# Patient Record
Sex: Female | Born: 1950 | Race: White | Hispanic: No | Marital: Single | Smoking: Never smoker
Health system: Southern US, Community
[De-identification: ages and names within clinical notes are randomized; demographics above are authoritative.]

## PROBLEM LIST (undated history)

## (undated) DIAGNOSIS — F419 Anxiety disorder, unspecified: Secondary | ICD-10-CM

## (undated) DIAGNOSIS — M5136 Other intervertebral disc degeneration, lumbar region: Secondary | ICD-10-CM

## (undated) DIAGNOSIS — M51369 Other intervertebral disc degeneration, lumbar region without mention of lumbar back pain or lower extremity pain: Secondary | ICD-10-CM

## (undated) DIAGNOSIS — R002 Palpitations: Secondary | ICD-10-CM

## (undated) DIAGNOSIS — L409 Psoriasis, unspecified: Secondary | ICD-10-CM

## (undated) DIAGNOSIS — I341 Nonrheumatic mitral (valve) prolapse: Secondary | ICD-10-CM

## (undated) DIAGNOSIS — I7121 Aneurysm of the ascending aorta, without rupture: Secondary | ICD-10-CM

## (undated) HISTORY — DX: Psoriasis, unspecified: L40.9

## (undated) HISTORY — DX: Palpitations: R00.2

## (undated) HISTORY — DX: Anxiety disorder, unspecified: F41.9

## (undated) HISTORY — DX: Aneurysm of the ascending aorta, without rupture: I71.21

---

## 1998-09-05 ENCOUNTER — Other Ambulatory Visit: Admission: RE | Admit: 1998-09-05 | Discharge: 1998-09-05 | Payer: Self-pay | Admitting: Obstetrics and Gynecology

## 1999-09-08 ENCOUNTER — Other Ambulatory Visit: Admission: RE | Admit: 1999-09-08 | Discharge: 1999-09-08 | Payer: Self-pay | Admitting: Obstetrics and Gynecology

## 2000-10-11 ENCOUNTER — Other Ambulatory Visit: Admission: RE | Admit: 2000-10-11 | Discharge: 2000-10-11 | Payer: Self-pay | Admitting: Obstetrics and Gynecology

## 2001-10-18 ENCOUNTER — Other Ambulatory Visit: Admission: RE | Admit: 2001-10-18 | Discharge: 2001-10-18 | Payer: Self-pay | Admitting: Obstetrics and Gynecology

## 2002-10-31 ENCOUNTER — Other Ambulatory Visit: Admission: RE | Admit: 2002-10-31 | Discharge: 2002-10-31 | Payer: Self-pay | Admitting: Obstetrics and Gynecology

## 2003-11-14 ENCOUNTER — Other Ambulatory Visit: Admission: RE | Admit: 2003-11-14 | Discharge: 2003-11-14 | Payer: Self-pay | Admitting: Obstetrics and Gynecology

## 2004-12-11 ENCOUNTER — Other Ambulatory Visit: Admission: RE | Admit: 2004-12-11 | Discharge: 2004-12-11 | Payer: Self-pay | Admitting: Obstetrics and Gynecology

## 2005-12-29 ENCOUNTER — Other Ambulatory Visit: Admission: RE | Admit: 2005-12-29 | Discharge: 2005-12-29 | Payer: Self-pay | Admitting: Obstetrics and Gynecology

## 2006-08-24 ENCOUNTER — Encounter: Admission: RE | Admit: 2006-08-24 | Discharge: 2006-08-24 | Payer: Self-pay | Admitting: Family Medicine

## 2007-08-16 ENCOUNTER — Encounter: Admission: RE | Admit: 2007-08-16 | Discharge: 2007-08-16 | Payer: Self-pay | Admitting: Family Medicine

## 2008-06-09 ENCOUNTER — Encounter: Admission: RE | Admit: 2008-06-09 | Discharge: 2008-06-09 | Payer: Self-pay | Admitting: Family Medicine

## 2009-02-20 ENCOUNTER — Emergency Department (HOSPITAL_COMMUNITY): Admission: EM | Admit: 2009-02-20 | Discharge: 2009-02-20 | Payer: Self-pay | Admitting: Emergency Medicine

## 2010-05-28 ENCOUNTER — Encounter: Admission: RE | Admit: 2010-05-28 | Discharge: 2010-05-28 | Payer: Self-pay | Admitting: Family Medicine

## 2010-07-01 ENCOUNTER — Encounter: Admission: RE | Admit: 2010-07-01 | Discharge: 2010-07-01 | Payer: Self-pay | Admitting: Orthopaedic Surgery

## 2010-12-20 ENCOUNTER — Encounter: Payer: Self-pay | Admitting: Family Medicine

## 2010-12-21 ENCOUNTER — Encounter: Payer: Self-pay | Admitting: Family Medicine

## 2010-12-21 ENCOUNTER — Encounter: Payer: Self-pay | Admitting: Obstetrics and Gynecology

## 2013-01-13 ENCOUNTER — Encounter (HOSPITAL_COMMUNITY): Payer: Self-pay | Admitting: Emergency Medicine

## 2013-01-13 ENCOUNTER — Emergency Department (HOSPITAL_COMMUNITY)
Admission: EM | Admit: 2013-01-13 | Discharge: 2013-01-13 | Disposition: A | Discharge status: Home / Self Care | Payer: BC Managed Care – PPO | Attending: Emergency Medicine | Admitting: Emergency Medicine

## 2013-01-13 ENCOUNTER — Emergency Department (HOSPITAL_COMMUNITY): Payer: BC Managed Care – PPO

## 2013-01-13 DIAGNOSIS — Z79899 Other long term (current) drug therapy: Secondary | ICD-10-CM | POA: Insufficient documentation

## 2013-01-13 DIAGNOSIS — Y9229 Other specified public building as the place of occurrence of the external cause: Secondary | ICD-10-CM | POA: Insufficient documentation

## 2013-01-13 DIAGNOSIS — W010XXA Fall on same level from slipping, tripping and stumbling without subsequent striking against object, initial encounter: Secondary | ICD-10-CM | POA: Insufficient documentation

## 2013-01-13 DIAGNOSIS — Y9389 Activity, other specified: Secondary | ICD-10-CM | POA: Insufficient documentation

## 2013-01-13 DIAGNOSIS — Z8679 Personal history of other diseases of the circulatory system: Secondary | ICD-10-CM | POA: Insufficient documentation

## 2013-01-13 DIAGNOSIS — S32009A Unspecified fracture of unspecified lumbar vertebra, initial encounter for closed fracture: Secondary | ICD-10-CM | POA: Insufficient documentation

## 2013-01-13 DIAGNOSIS — S32000A Wedge compression fracture of unspecified lumbar vertebra, initial encounter for closed fracture: Secondary | ICD-10-CM

## 2013-01-13 DIAGNOSIS — Z8739 Personal history of other diseases of the musculoskeletal system and connective tissue: Secondary | ICD-10-CM | POA: Insufficient documentation

## 2013-01-13 HISTORY — DX: Nonrheumatic mitral (valve) prolapse: I34.1

## 2013-01-13 HISTORY — DX: Other intervertebral disc degeneration, lumbar region: M51.36

## 2013-01-13 HISTORY — DX: Other intervertebral disc degeneration, lumbar region without mention of lumbar back pain or lower extremity pain: M51.369

## 2013-01-13 MED ORDER — IBUPROFEN 400 MG PO TABS: 600.0000 mg | ORAL_TABLET | Freq: Once | ORAL | Status: DC

## 2013-01-13 MED ORDER — OXYCODONE-ACETAMINOPHEN 5-325 MG PO TABS: 1.0000 | ORAL_TABLET | ORAL | Status: DC | PRN

## 2013-01-13 NOTE — ED Notes (Signed)
Patient claims she fell on black ice this morning when she was going into work.

## 2013-01-13 NOTE — ED Provider Notes (Signed)
History    62 year old female with lower back pain. Patient had a mechanical fall earlier today where she was getting ready to go work. Persistent back pain since. Patient has been able to work although she does have some increase in pain when she walks. No acute numbness, tingling or loss of strength. No urinary complaints. Pain does not radiate. No intervention prior to arrival. Patient declining pain medication.  CSN: [id number]  Arrival date & time 01/13/13  4098   First MD Initiated Contact with Patient 01/13/13 0940      Chief Complaint  Patient presents with  . Fall    (Consider location/radiation/quality/duration/timing/severity/associated sxs/prior treatment) HPI  Past Medical History  Diagnosis Date  . Degenerative disc disease, lumbar   . Mitral valve prolapse     History reviewed. No pertinent past surgical history.  No family history on file.  History  Substance Use Topics  . Smoking status: Never Smoker   . Smokeless tobacco: Not on file  . Alcohol Use: No    OB History   Grav Para Term Preterm Abortions TAB SAB Ect Mult Living                  Review of Systems  All systems reviewed and negative, other than as noted in HPI.   Allergies  Sulfa antibiotics; Ciprofloxacin; Codeine; Other; and Penicillins  Home Medications   Current Outpatient Rx  Name  Route  Sig  Dispense  Refill  . Calcium Carbonate-Vitamin D (CALCIUM + D PO)   Oral   Take 1 tablet by mouth daily.         Marland Kitchen LORazepam (ATIVAN) 1 MG tablet   Oral   Take 1 mg by mouth every 8 (eight) hours. For anxiety         . Multiple Vitamin (DAILY VITAMINS PO)   Oral   Take 15 mLs by mouth daily.         . propranolol (INDERAL) 20 MG tablet   Oral   Take 10-20 mg by mouth 3 (three) times daily. Takes 20 mg first 2 doses and 10 mg last dose daily         . Saline (SIMPLY SALINE) 0.9 % AERS   Nasal   Place 1 spray into the nose daily.         Marland Kitchen oxyCODONE-acetaminophen  (PERCOCET/ROXICET) 5-325 MG per tablet   Oral   Take 1 tablet by mouth every 4 (four) hours as needed for pain.   15 tablet   0     BP 119/52  Pulse 87  Temp(Src) 98.2 F (36.8 C) (Oral)  Resp 18  SpO2 99%  Physical Exam  Nursing note and vitals reviewed. Constitutional: She appears well-developed and well-nourished. No distress.  HENT:  Head: Normocephalic and atraumatic.  Eyes: Conjunctivae are normal. Right eye exhibits no discharge. Left eye exhibits no discharge.  Neck: Neck supple.  Cardiovascular: Normal rate, regular rhythm and normal heart sounds.  Exam reveals no gallop and no friction rub.   No murmur heard. Pulmonary/Chest: Effort normal and breath sounds normal. No respiratory distress.  Abdominal: Soft. She exhibits no distension. There is no tenderness.  Musculoskeletal: She exhibits no edema and no tenderness.  Some mild tenderness in the midline in the lower lumbar region. No midline spinal tenderness elsewhere. No step-off or deformity. No concerning overlying skin changes. Pelvis is stable to rocking. No pain with range of motion of either hip.  Neurological: She is alert.  Skin: Skin is warm and dry.  Psychiatric: She has a normal mood and affect. Her behavior is normal. Thought content normal.    ED Course  Procedures (including critical care time)  Labs Reviewed - No data to display Dg Lumbar Spine Complete  01/13/2013  *RADIOLOGY REPORT*  Clinical Data: History of injury from fall with low back pain.  LUMBAR SPINE - COMPLETE 4+ VIEW  Comparison: Lumbar MRI 07/10/2006.  Findings: There are five non-rib bearing lumbar-type vertebral bodies.  On the AP image there is very slight scoliosis or splinting convexity to the left.  No pars defects are seen.  There is minimal facet degenerative spondylosis at L5-S1.  Since the 2007 MRI examination there is loss of chronic of the vertebral body of L5 and development of concavity of the superior endplate of L5  vertebral body consistent with compression fracture. The age of L5 compression fracture is not known but it is new since the 2070 MRI study.  By plain imaging no retropulsion of bone is evident.  No other evidence of fracture is seen.  No subluxation or dislocation is evident.  SI joints appear intact.  There is fecal distention of portions of the colon.  IMPRESSION: Since the 2007 MRI examination there is loss of chronic of the vertebral body of L5 and development of concavity of the superior endplate of L5 vertebral body consistent with compression fracture. The age of L5 compression fracture is not known but it is new since the 2070 MRI study.   Original Report Authenticated By: Onalee Hua Call      1. Compression fracture of lumbar vertebra       MDM  62 year old female with lower back pain after fall. Imaging shows lumbar compression fracture of indeterminate age. Clinical suspicion that her pain is more related to muscular strain. She is ambulating with little apparent difficulty. Patient has a nonfocal neurological examination. We'll treat symptomatically. Patient has previously seen orthopedics for her lower back pain. Followup as needed.       Raeford Razor, MD 01/15/13 [id number]

## 2014-11-21 ENCOUNTER — Other Ambulatory Visit: Payer: Self-pay | Admitting: Orthopaedic Surgery

## 2014-11-21 DIAGNOSIS — S32000S Wedge compression fracture of unspecified lumbar vertebra, sequela: Secondary | ICD-10-CM

## 2014-11-21 DIAGNOSIS — M545 Low back pain: Secondary | ICD-10-CM

## 2014-12-26 DIAGNOSIS — F419 Anxiety disorder, unspecified: Secondary | ICD-10-CM | POA: Diagnosis not present

## 2014-12-26 DIAGNOSIS — R634 Abnormal weight loss: Secondary | ICD-10-CM | POA: Diagnosis not present

## 2015-08-20 ENCOUNTER — Emergency Department (HOSPITAL_COMMUNITY): Payer: Medicare Other

## 2015-08-20 ENCOUNTER — Emergency Department (HOSPITAL_COMMUNITY)
Admission: EM | Admit: 2015-08-20 | Discharge: 2015-08-20 | Disposition: A | Discharge status: Home / Self Care | Payer: Medicare Other | Attending: Emergency Medicine | Admitting: Emergency Medicine

## 2015-08-20 ENCOUNTER — Encounter (HOSPITAL_COMMUNITY): Payer: Self-pay | Admitting: *Deleted

## 2015-08-20 DIAGNOSIS — W01198A Fall on same level from slipping, tripping and stumbling with subsequent striking against other object, initial encounter: Secondary | ICD-10-CM | POA: Diagnosis not present

## 2015-08-20 DIAGNOSIS — Y9389 Activity, other specified: Secondary | ICD-10-CM | POA: Diagnosis not present

## 2015-08-20 DIAGNOSIS — S4992XA Unspecified injury of left shoulder and upper arm, initial encounter: Secondary | ICD-10-CM | POA: Diagnosis present

## 2015-08-20 DIAGNOSIS — Y9289 Other specified places as the place of occurrence of the external cause: Secondary | ICD-10-CM | POA: Diagnosis not present

## 2015-08-20 DIAGNOSIS — S060X0A Concussion without loss of consciousness, initial encounter: Secondary | ICD-10-CM | POA: Insufficient documentation

## 2015-08-20 DIAGNOSIS — S098XXA Other specified injuries of head, initial encounter: Secondary | ICD-10-CM | POA: Diagnosis not present

## 2015-08-20 DIAGNOSIS — Z79899 Other long term (current) drug therapy: Secondary | ICD-10-CM | POA: Insufficient documentation

## 2015-08-20 DIAGNOSIS — Z880 Allergy status to penicillin: Secondary | ICD-10-CM | POA: Diagnosis not present

## 2015-08-20 DIAGNOSIS — S8992XA Unspecified injury of left lower leg, initial encounter: Secondary | ICD-10-CM | POA: Diagnosis not present

## 2015-08-20 DIAGNOSIS — Z8739 Personal history of other diseases of the musculoskeletal system and connective tissue: Secondary | ICD-10-CM | POA: Insufficient documentation

## 2015-08-20 DIAGNOSIS — I341 Nonrheumatic mitral (valve) prolapse: Secondary | ICD-10-CM | POA: Insufficient documentation

## 2015-08-20 DIAGNOSIS — Y998 Other external cause status: Secondary | ICD-10-CM | POA: Diagnosis not present

## 2015-08-20 DIAGNOSIS — R52 Pain, unspecified: Secondary | ICD-10-CM | POA: Diagnosis not present

## 2015-08-20 DIAGNOSIS — S40012A Contusion of left shoulder, initial encounter: Secondary | ICD-10-CM

## 2015-08-20 DIAGNOSIS — S8991XA Unspecified injury of right lower leg, initial encounter: Secondary | ICD-10-CM | POA: Diagnosis not present

## 2015-08-20 DIAGNOSIS — S0990XA Unspecified injury of head, initial encounter: Secondary | ICD-10-CM | POA: Diagnosis not present

## 2015-08-20 MED ORDER — IBUPROFEN 800 MG PO TABS: 800.0000 mg | ORAL_TABLET | Freq: Three times a day (TID) | ORAL | Status: DC

## 2015-08-20 NOTE — ED Provider Notes (Signed)
CSN: [id number]     Arrival date & time 08/20/15  1925 History   First MD Initiated Contact with Patient 08/20/15 1949     Chief Complaint  Patient presents with  . Fall     (Consider location/radiation/quality/duration/timing/severity/associated sxs/prior Treatment) HPI  The pt has hx of fall that occurred just prior to arrival at the store when her leg slipped on something - she fell and struck her L face on the floor - no LOC, no HA, no n/v/ and no numbness / weakness or difficulty ambulating - she struck her L shoulder and her bilateral knees.  Pain is mild, consdtant and not assocaited with laceration.  Past Medical History  Diagnosis Date  . Degenerative disc disease, lumbar   . Mitral valve prolapse    History reviewed. No pertinent past surgical history. No family history on file. Social History  Substance Use Topics  . Smoking status: Never Smoker   . Smokeless tobacco: None  . Alcohol Use: No   OB History    No data available     Review of Systems  All other systems reviewed and are negative.     Allergies  Sulfa antibiotics; Ciprofloxacin; Codeine; Other; and Penicillins  Home Medications   Prior to Admission medications   Medication Sig Start Date End Date Taking? Authorizing Provider  Calcium Carbonate-Vitamin D (CALCIUM + D PO) Take 1 tablet by mouth daily.   Yes Historical Provider, MD  LORazepam (ATIVAN) 1 MG tablet Take 1 mg by mouth every 8 (eight) hours. For anxiety   Yes Historical Provider, MD  Multiple Vitamin (DAILY VITAMINS PO) Take 15 mLs by mouth daily.   Yes Historical Provider, MD  propranolol (INDERAL) 10 MG tablet Take 5-10 mg by mouth 3 (three) times daily. Takes 10 mg first two doses and then 5 mg last dose.   Yes Historical Provider, MD  Saline (SIMPLY SALINE) 0.9 % AERS Place 1 spray into the nose 2 (two) times daily.    Yes Historical Provider, MD  ibuprofen (ADVIL,MOTRIN) 800 MG tablet Take 1 tablet (800 mg total) by mouth 3 (three)  times daily. 08/20/15   Noemi Chapel, MD   BP 158/62 mmHg  Pulse 87  Temp(Src) 98.1 F (36.7 C) (Oral)  Resp 17  SpO2 100% Physical Exam  Constitutional: She appears well-developed and well-nourished. No distress.  HENT:  Head: Normocephalic.  Mouth/Throat: Oropharynx is clear and moist. No oropharyngeal exudate.  No hemotympanum, no malocclusion, no raccoon eyes, no battle sign, mild tenderness over the left zygoma, no swelling, no hematoma, no contusion  Eyes: Conjunctivae and EOM are normal. Pupils are equal, round, and reactive to light. Right eye exhibits no discharge. Left eye exhibits no discharge. No scleral icterus.  Neck: Normal range of motion. Neck supple. No JVD present. No thyromegaly present.  Full range of motion, no tenderness over the posterior cervical spine  Cardiovascular: Normal rate, regular rhythm, normal heart sounds and intact distal pulses.  Exam reveals no gallop and no friction rub.   No murmur heard. Pulmonary/Chest: Effort normal and breath sounds normal. No respiratory distress. She has no wheezes. She has no rales.  Abdominal: Soft. Bowel sounds are normal. She exhibits no distension and no mass. There is no tenderness.  Musculoskeletal: Normal range of motion. She exhibits tenderness ( Tenderness over the left shoulder, decreased range of motion secondary to pain, mild tenderness of the bilateral patella, able to lift both legs in straight leg fashion without difficulty, normal range  of motion of the knees). She exhibits no edema.  Lymphadenopathy:    She has no cervical adenopathy.  Neurological: She is alert. Coordination normal.  Skin: Skin is warm and dry. No rash noted. No erythema.  Psychiatric: She has a normal mood and affect. Her behavior is normal.  Nursing note and vitals reviewed.   ED Course  Procedures (including critical care time) Labs Review Labs Reviewed - No data to display  Imaging Review Dg Shoulder Left  08/20/2015   CLINICAL  DATA:  64 year old female with left-sided shoulder pain after falling while shopping earlier today  EXAM: LEFT SHOULDER - 2+ VIEW  COMPARISON:  None.  FINDINGS: There is no evidence of fracture or dislocation. There is no evidence of arthropathy or other focal bone abnormality. Soft tissues are unremarkable.  IMPRESSION: Negative.   Electronically Signed   By: Jacqulynn Cadet M.D.   On: 08/20/2015 21:30   I have personally reviewed and evaluated these images and lab results as part of my medical decision-making.    MDM   Final diagnoses:  Contusion of shoulder, left, initial encounter  Concussion, without loss of consciousness, initial encounter    Mild head injury, mild musculoskeletal injuries, imaging of the left shoulder secondary to decreased range of motion, no need for head imaging, low risk by criteria, denies pain medication, declines offers for pain medication, agreeable to ice pack for the shoulder  Xray neg,   I have personally viewed and interpreted the imaging and agree with radiologist interpretation.  VS normal -   Meds given in ED:  Medications - No data to display  New Prescriptions   IBUPROFEN (ADVIL,MOTRIN) 800 MG TABLET    Take 1 tablet (800 mg total) by mouth 3 (three) times daily.      Noemi Chapel, MD 08/20/15 2136

## 2015-08-20 NOTE — ED Notes (Signed)
Pt arrives via EMS. Pt was at [facility] and slipped on a toothpick and fell onto her left side. Pt c/o abrasion to the left cheek bone, left shoulder abrasion and bruising, left hip pain, head pain (no LOC, no blood thinners).

## 2015-08-20 NOTE — Discharge Instructions (Signed)

## 2015-08-20 NOTE — ED Notes (Signed)
Pt states there was a toothpick on tile floor and she skid on it, she hit her cheek, shoulder and both her knees. She hit her head and had an immediate headache, pt denies LOC.

## 2015-08-20 NOTE — ED Notes (Signed)
Bed: TC48 Expected date:  Expected time:  Means of arrival:  Comments: EMS fall elderly

## 2015-08-30 ENCOUNTER — Ambulatory Visit
Admission: RE | Admit: 2015-08-30 | Discharge: 2015-08-30 | Disposition: A | Discharge status: Home / Self Care | Payer: Medicare Other | Source: Ambulatory Visit | Attending: Family Medicine | Admitting: Family Medicine

## 2015-08-30 ENCOUNTER — Other Ambulatory Visit: Payer: Self-pay | Admitting: Family Medicine

## 2015-08-30 DIAGNOSIS — M25552 Pain in left hip: Secondary | ICD-10-CM

## 2017-05-14 ENCOUNTER — Other Ambulatory Visit: Payer: Self-pay | Admitting: Family Medicine

## 2017-05-14 DIAGNOSIS — R110 Nausea: Secondary | ICD-10-CM

## 2017-05-14 DIAGNOSIS — R1011 Right upper quadrant pain: Secondary | ICD-10-CM

## 2017-09-13 DIAGNOSIS — K640 First degree hemorrhoids: Secondary | ICD-10-CM | POA: Diagnosis not present

## 2017-09-13 DIAGNOSIS — R109 Unspecified abdominal pain: Secondary | ICD-10-CM | POA: Diagnosis not present

## 2017-09-13 DIAGNOSIS — K581 Irritable bowel syndrome with constipation: Secondary | ICD-10-CM | POA: Diagnosis not present

## 2017-09-21 DIAGNOSIS — R1084 Generalized abdominal pain: Secondary | ICD-10-CM | POA: Diagnosis not present

## 2017-09-21 DIAGNOSIS — M549 Dorsalgia, unspecified: Secondary | ICD-10-CM | POA: Diagnosis not present

## 2017-09-21 DIAGNOSIS — K581 Irritable bowel syndrome with constipation: Secondary | ICD-10-CM | POA: Diagnosis not present

## 2017-09-21 DIAGNOSIS — R002 Palpitations: Secondary | ICD-10-CM | POA: Diagnosis not present

## 2017-09-22 ENCOUNTER — Other Ambulatory Visit: Payer: Self-pay | Admitting: Family Medicine

## 2017-09-22 DIAGNOSIS — R1084 Generalized abdominal pain: Secondary | ICD-10-CM

## 2017-10-04 ENCOUNTER — Ambulatory Visit
Admission: RE | Admit: 2017-10-04 | Discharge: 2017-10-04 | Disposition: A | Discharge status: Home / Self Care | Payer: BLUE CROSS/BLUE SHIELD | Source: Ambulatory Visit | Attending: Family Medicine | Admitting: Family Medicine

## 2017-10-04 DIAGNOSIS — R1084 Generalized abdominal pain: Secondary | ICD-10-CM

## 2017-10-26 DIAGNOSIS — Z13220 Encounter for screening for lipoid disorders: Secondary | ICD-10-CM | POA: Diagnosis not present

## 2017-10-26 DIAGNOSIS — R1011 Right upper quadrant pain: Secondary | ICD-10-CM | POA: Diagnosis not present

## 2017-12-17 DIAGNOSIS — H5213 Myopia, bilateral: Secondary | ICD-10-CM | POA: Diagnosis not present

## 2017-12-17 DIAGNOSIS — H2513 Age-related nuclear cataract, bilateral: Secondary | ICD-10-CM | POA: Diagnosis not present

## 2018-01-23 DIAGNOSIS — M76811 Anterior tibial syndrome, right leg: Secondary | ICD-10-CM | POA: Diagnosis not present

## 2018-02-17 DIAGNOSIS — Z01419 Encounter for gynecological examination (general) (routine) without abnormal findings: Secondary | ICD-10-CM | POA: Diagnosis not present

## 2018-02-17 DIAGNOSIS — Z1231 Encounter for screening mammogram for malignant neoplasm of breast: Secondary | ICD-10-CM | POA: Diagnosis not present

## 2018-02-17 DIAGNOSIS — Z6822 Body mass index (BMI) 22.0-22.9, adult: Secondary | ICD-10-CM | POA: Diagnosis not present

## 2018-02-21 DIAGNOSIS — I4510 Unspecified right bundle-branch block: Secondary | ICD-10-CM | POA: Diagnosis not present

## 2018-02-21 DIAGNOSIS — R002 Palpitations: Secondary | ICD-10-CM | POA: Diagnosis not present

## 2018-02-24 DIAGNOSIS — R002 Palpitations: Secondary | ICD-10-CM | POA: Diagnosis not present

## 2018-03-10 DIAGNOSIS — J029 Acute pharyngitis, unspecified: Secondary | ICD-10-CM | POA: Diagnosis not present

## 2018-03-10 DIAGNOSIS — F439 Reaction to severe stress, unspecified: Secondary | ICD-10-CM | POA: Diagnosis not present

## 2018-03-22 DIAGNOSIS — R002 Palpitations: Secondary | ICD-10-CM | POA: Diagnosis not present

## 2018-03-22 DIAGNOSIS — F419 Anxiety disorder, unspecified: Secondary | ICD-10-CM | POA: Diagnosis not present

## 2018-03-30 DIAGNOSIS — J3489 Other specified disorders of nose and nasal sinuses: Secondary | ICD-10-CM | POA: Diagnosis not present

## 2018-03-30 DIAGNOSIS — J019 Acute sinusitis, unspecified: Secondary | ICD-10-CM | POA: Diagnosis not present

## 2018-04-01 DIAGNOSIS — L718 Other rosacea: Secondary | ICD-10-CM | POA: Diagnosis not present

## 2018-04-01 DIAGNOSIS — D2372 Other benign neoplasm of skin of left lower limb, including hip: Secondary | ICD-10-CM | POA: Diagnosis not present

## 2018-04-01 DIAGNOSIS — L821 Other seborrheic keratosis: Secondary | ICD-10-CM | POA: Diagnosis not present

## 2018-04-01 DIAGNOSIS — D2371 Other benign neoplasm of skin of right lower limb, including hip: Secondary | ICD-10-CM | POA: Diagnosis not present

## 2018-04-11 DIAGNOSIS — F4323 Adjustment disorder with mixed anxiety and depressed mood: Secondary | ICD-10-CM | POA: Diagnosis not present

## 2018-04-14 DIAGNOSIS — F331 Major depressive disorder, recurrent, moderate: Secondary | ICD-10-CM | POA: Diagnosis not present

## 2018-04-21 DIAGNOSIS — F4321 Adjustment disorder with depressed mood: Secondary | ICD-10-CM | POA: Diagnosis not present

## 2018-04-21 DIAGNOSIS — K2970 Gastritis, unspecified, without bleeding: Secondary | ICD-10-CM | POA: Diagnosis not present

## 2018-05-12 DIAGNOSIS — K589 Irritable bowel syndrome without diarrhea: Secondary | ICD-10-CM | POA: Diagnosis not present

## 2018-05-12 DIAGNOSIS — F419 Anxiety disorder, unspecified: Secondary | ICD-10-CM | POA: Diagnosis not present

## 2018-05-24 DIAGNOSIS — Z13820 Encounter for screening for osteoporosis: Secondary | ICD-10-CM | POA: Diagnosis not present

## 2018-06-03 DIAGNOSIS — M549 Dorsalgia, unspecified: Secondary | ICD-10-CM | POA: Diagnosis not present

## 2018-06-03 DIAGNOSIS — R002 Palpitations: Secondary | ICD-10-CM | POA: Diagnosis not present

## 2018-06-03 DIAGNOSIS — F419 Anxiety disorder, unspecified: Secondary | ICD-10-CM | POA: Diagnosis not present

## 2018-06-03 DIAGNOSIS — K581 Irritable bowel syndrome with constipation: Secondary | ICD-10-CM | POA: Diagnosis not present

## 2018-08-12 DIAGNOSIS — F419 Anxiety disorder, unspecified: Secondary | ICD-10-CM | POA: Diagnosis not present

## 2018-08-12 DIAGNOSIS — M810 Age-related osteoporosis without current pathological fracture: Secondary | ICD-10-CM | POA: Diagnosis not present

## 2018-08-12 DIAGNOSIS — R002 Palpitations: Secondary | ICD-10-CM | POA: Diagnosis not present

## 2018-08-24 DIAGNOSIS — L720 Epidermal cyst: Secondary | ICD-10-CM | POA: Diagnosis not present

## 2018-08-29 DIAGNOSIS — M25552 Pain in left hip: Secondary | ICD-10-CM | POA: Diagnosis not present

## 2018-08-29 DIAGNOSIS — R002 Palpitations: Secondary | ICD-10-CM | POA: Diagnosis not present

## 2018-08-29 DIAGNOSIS — F419 Anxiety disorder, unspecified: Secondary | ICD-10-CM | POA: Diagnosis not present

## 2018-08-29 DIAGNOSIS — M79604 Pain in right leg: Secondary | ICD-10-CM | POA: Diagnosis not present

## 2018-08-29 DIAGNOSIS — M25551 Pain in right hip: Secondary | ICD-10-CM | POA: Diagnosis not present

## 2018-08-29 DIAGNOSIS — M79605 Pain in left leg: Secondary | ICD-10-CM | POA: Diagnosis not present

## 2018-08-29 DIAGNOSIS — M810 Age-related osteoporosis without current pathological fracture: Secondary | ICD-10-CM | POA: Diagnosis not present

## 2018-09-13 DIAGNOSIS — S91309A Unspecified open wound, unspecified foot, initial encounter: Secondary | ICD-10-CM | POA: Diagnosis not present

## 2018-09-23 DIAGNOSIS — M545 Low back pain: Secondary | ICD-10-CM | POA: Diagnosis not present

## 2018-09-23 DIAGNOSIS — R002 Palpitations: Secondary | ICD-10-CM | POA: Diagnosis not present

## 2018-09-23 DIAGNOSIS — F419 Anxiety disorder, unspecified: Secondary | ICD-10-CM | POA: Diagnosis not present

## 2018-10-12 DIAGNOSIS — S0501XA Injury of conjunctiva and corneal abrasion without foreign body, right eye, initial encounter: Secondary | ICD-10-CM | POA: Diagnosis not present

## 2018-10-16 DIAGNOSIS — L309 Dermatitis, unspecified: Secondary | ICD-10-CM | POA: Diagnosis not present

## 2018-10-22 DIAGNOSIS — M25571 Pain in right ankle and joints of right foot: Secondary | ICD-10-CM | POA: Diagnosis not present

## 2018-10-26 DIAGNOSIS — L0889 Other specified local infections of the skin and subcutaneous tissue: Secondary | ICD-10-CM | POA: Diagnosis not present

## 2018-10-26 DIAGNOSIS — L300 Nummular dermatitis: Secondary | ICD-10-CM | POA: Diagnosis not present

## 2018-10-26 DIAGNOSIS — L011 Impetiginization of other dermatoses: Secondary | ICD-10-CM | POA: Diagnosis not present

## 2018-10-30 DIAGNOSIS — L309 Dermatitis, unspecified: Secondary | ICD-10-CM | POA: Diagnosis not present

## 2018-11-01 DIAGNOSIS — L308 Other specified dermatitis: Secondary | ICD-10-CM | POA: Diagnosis not present

## 2018-11-10 DIAGNOSIS — L300 Nummular dermatitis: Secondary | ICD-10-CM | POA: Diagnosis not present

## 2019-01-11 DIAGNOSIS — R002 Palpitations: Secondary | ICD-10-CM | POA: Diagnosis not present

## 2019-01-11 DIAGNOSIS — E559 Vitamin D deficiency, unspecified: Secondary | ICD-10-CM | POA: Diagnosis not present

## 2019-01-11 DIAGNOSIS — E7800 Pure hypercholesterolemia, unspecified: Secondary | ICD-10-CM | POA: Diagnosis not present

## 2019-01-11 DIAGNOSIS — F419 Anxiety disorder, unspecified: Secondary | ICD-10-CM | POA: Diagnosis not present

## 2019-01-23 DIAGNOSIS — K219 Gastro-esophageal reflux disease without esophagitis: Secondary | ICD-10-CM | POA: Diagnosis not present

## 2019-02-06 DIAGNOSIS — G43909 Migraine, unspecified, not intractable, without status migrainosus: Secondary | ICD-10-CM | POA: Diagnosis not present

## 2019-02-08 DIAGNOSIS — E559 Vitamin D deficiency, unspecified: Secondary | ICD-10-CM | POA: Diagnosis not present

## 2019-02-08 DIAGNOSIS — E7800 Pure hypercholesterolemia, unspecified: Secondary | ICD-10-CM | POA: Diagnosis not present

## 2019-03-27 DIAGNOSIS — L309 Dermatitis, unspecified: Secondary | ICD-10-CM | POA: Diagnosis not present

## 2019-04-07 DIAGNOSIS — L309 Dermatitis, unspecified: Secondary | ICD-10-CM | POA: Diagnosis not present

## 2019-04-07 DIAGNOSIS — R002 Palpitations: Secondary | ICD-10-CM | POA: Diagnosis not present

## 2019-05-04 DIAGNOSIS — R002 Palpitations: Secondary | ICD-10-CM | POA: Diagnosis not present

## 2019-05-04 DIAGNOSIS — E559 Vitamin D deficiency, unspecified: Secondary | ICD-10-CM | POA: Diagnosis not present

## 2019-05-04 DIAGNOSIS — M545 Low back pain: Secondary | ICD-10-CM | POA: Diagnosis not present

## 2019-05-04 DIAGNOSIS — E7800 Pure hypercholesterolemia, unspecified: Secondary | ICD-10-CM | POA: Diagnosis not present

## 2019-09-05 DIAGNOSIS — R002 Palpitations: Secondary | ICD-10-CM | POA: Diagnosis not present

## 2019-09-05 DIAGNOSIS — F419 Anxiety disorder, unspecified: Secondary | ICD-10-CM | POA: Diagnosis not present

## 2019-09-05 DIAGNOSIS — E7800 Pure hypercholesterolemia, unspecified: Secondary | ICD-10-CM | POA: Diagnosis not present

## 2019-09-05 DIAGNOSIS — E559 Vitamin D deficiency, unspecified: Secondary | ICD-10-CM | POA: Diagnosis not present

## 2019-09-22 DIAGNOSIS — H2513 Age-related nuclear cataract, bilateral: Secondary | ICD-10-CM | POA: Diagnosis not present

## 2019-09-22 DIAGNOSIS — H5213 Myopia, bilateral: Secondary | ICD-10-CM | POA: Diagnosis not present

## 2019-11-01 DIAGNOSIS — E7800 Pure hypercholesterolemia, unspecified: Secondary | ICD-10-CM | POA: Diagnosis not present

## 2019-11-01 DIAGNOSIS — K581 Irritable bowel syndrome with constipation: Secondary | ICD-10-CM | POA: Diagnosis not present

## 2019-11-01 DIAGNOSIS — H2589 Other age-related cataract: Secondary | ICD-10-CM | POA: Diagnosis not present

## 2019-11-01 DIAGNOSIS — R002 Palpitations: Secondary | ICD-10-CM | POA: Diagnosis not present

## 2019-11-08 DIAGNOSIS — Z01419 Encounter for gynecological examination (general) (routine) without abnormal findings: Secondary | ICD-10-CM | POA: Diagnosis not present

## 2019-11-08 DIAGNOSIS — Z1231 Encounter for screening mammogram for malignant neoplasm of breast: Secondary | ICD-10-CM | POA: Diagnosis not present

## 2019-11-08 DIAGNOSIS — Z6820 Body mass index (BMI) 20.0-20.9, adult: Secondary | ICD-10-CM | POA: Diagnosis not present

## 2019-11-14 DIAGNOSIS — H25811 Combined forms of age-related cataract, right eye: Secondary | ICD-10-CM | POA: Diagnosis not present

## 2019-11-14 DIAGNOSIS — H2511 Age-related nuclear cataract, right eye: Secondary | ICD-10-CM | POA: Diagnosis not present

## 2019-11-21 DIAGNOSIS — E7800 Pure hypercholesterolemia, unspecified: Secondary | ICD-10-CM | POA: Diagnosis not present

## 2019-11-21 DIAGNOSIS — E559 Vitamin D deficiency, unspecified: Secondary | ICD-10-CM | POA: Diagnosis not present

## 2019-11-28 DIAGNOSIS — H2512 Age-related nuclear cataract, left eye: Secondary | ICD-10-CM | POA: Diagnosis not present

## 2019-11-30 DIAGNOSIS — L570 Actinic keratosis: Secondary | ICD-10-CM | POA: Diagnosis not present

## 2019-11-30 DIAGNOSIS — L821 Other seborrheic keratosis: Secondary | ICD-10-CM | POA: Diagnosis not present

## 2019-11-30 DIAGNOSIS — D2262 Melanocytic nevi of left upper limb, including shoulder: Secondary | ICD-10-CM | POA: Diagnosis not present

## 2019-11-30 DIAGNOSIS — D2261 Melanocytic nevi of right upper limb, including shoulder: Secondary | ICD-10-CM | POA: Diagnosis not present

## 2019-11-30 DIAGNOSIS — D225 Melanocytic nevi of trunk: Secondary | ICD-10-CM | POA: Diagnosis not present

## 2019-11-30 DIAGNOSIS — L2089 Other atopic dermatitis: Secondary | ICD-10-CM | POA: Diagnosis not present

## 2019-12-26 DIAGNOSIS — H59022 Cataract (lens) fragments in eye following cataract surgery, left eye: Secondary | ICD-10-CM | POA: Diagnosis not present

## 2019-12-30 ENCOUNTER — Encounter: Payer: Self-pay | Admitting: Internal Medicine

## 2019-12-30 DIAGNOSIS — H35372 Puckering of macula, left eye: Secondary | ICD-10-CM | POA: Diagnosis not present

## 2019-12-30 DIAGNOSIS — H44002 Unspecified purulent endophthalmitis, left eye: Secondary | ICD-10-CM | POA: Diagnosis not present

## 2019-12-30 DIAGNOSIS — H35412 Lattice degeneration of retina, left eye: Secondary | ICD-10-CM | POA: Diagnosis not present

## 2020-01-02 DIAGNOSIS — H44002 Unspecified purulent endophthalmitis, left eye: Secondary | ICD-10-CM | POA: Diagnosis not present

## 2020-01-04 DIAGNOSIS — F419 Anxiety disorder, unspecified: Secondary | ICD-10-CM | POA: Diagnosis not present

## 2020-01-04 DIAGNOSIS — R030 Elevated blood-pressure reading, without diagnosis of hypertension: Secondary | ICD-10-CM | POA: Diagnosis not present

## 2020-01-04 DIAGNOSIS — H44002 Unspecified purulent endophthalmitis, left eye: Secondary | ICD-10-CM | POA: Diagnosis not present

## 2020-01-04 DIAGNOSIS — R002 Palpitations: Secondary | ICD-10-CM | POA: Diagnosis not present

## 2020-01-09 DIAGNOSIS — H44002 Unspecified purulent endophthalmitis, left eye: Secondary | ICD-10-CM | POA: Diagnosis not present

## 2020-01-10 DIAGNOSIS — H44002 Unspecified purulent endophthalmitis, left eye: Secondary | ICD-10-CM | POA: Diagnosis not present

## 2020-01-10 DIAGNOSIS — H59021 Cataract (lens) fragments in eye following cataract surgery, right eye: Secondary | ICD-10-CM | POA: Diagnosis not present

## 2020-01-10 DIAGNOSIS — Z961 Presence of intraocular lens: Secondary | ICD-10-CM | POA: Diagnosis not present

## 2020-01-11 ENCOUNTER — Telehealth: Payer: Self-pay

## 2020-01-11 NOTE — Telephone Encounter (Signed)
Much thanks.

## 2020-01-11 NOTE — Telephone Encounter (Signed)
Per [name] called patient to set up appointment for patient tomorrow. Patient was able to take call and schedule appointment for 2/12 at 10am. Provided office contact information if she has any questions before appointment. [place]

## 2020-01-12 ENCOUNTER — Other Ambulatory Visit: Payer: Self-pay

## 2020-01-12 ENCOUNTER — Ambulatory Visit: Payer: Medicare Other | Admitting: Internal Medicine

## 2020-01-12 ENCOUNTER — Encounter: Payer: Self-pay | Admitting: Internal Medicine

## 2020-01-12 DIAGNOSIS — K580 Irritable bowel syndrome with diarrhea: Secondary | ICD-10-CM | POA: Diagnosis not present

## 2020-01-12 DIAGNOSIS — A318 Other mycobacterial infections: Secondary | ICD-10-CM

## 2020-01-12 DIAGNOSIS — A319 Mycobacterial infection, unspecified: Secondary | ICD-10-CM

## 2020-01-12 DIAGNOSIS — M539 Dorsopathy, unspecified: Secondary | ICD-10-CM | POA: Diagnosis not present

## 2020-01-12 DIAGNOSIS — Z9842 Cataract extraction status, left eye: Secondary | ICD-10-CM | POA: Diagnosis not present

## 2020-01-12 DIAGNOSIS — F419 Anxiety disorder, unspecified: Secondary | ICD-10-CM

## 2020-01-12 DIAGNOSIS — H44002 Unspecified purulent endophthalmitis, left eye: Secondary | ICD-10-CM

## 2020-01-12 DIAGNOSIS — Z961 Presence of intraocular lens: Secondary | ICD-10-CM

## 2020-01-12 DIAGNOSIS — I341 Nonrheumatic mitral (valve) prolapse: Secondary | ICD-10-CM

## 2020-01-12 DIAGNOSIS — K589 Irritable bowel syndrome without diarrhea: Secondary | ICD-10-CM | POA: Insufficient documentation

## 2020-01-12 MED ORDER — CLARITHROMYCIN 250 MG/5ML PO SUSR: 500.0000 mg | Freq: Two times a day (BID) | ORAL | 2 refills | Status: DC

## 2020-01-12 MED ORDER — CLOFAZIMINE POWD: 100.0000 mg | Freq: Every day | 2 refills | Status: DC

## 2020-01-12 NOTE — Progress Notes (Addendum)
[facility]  Reason for Consult: Mycobacterium abscessus endophthalmitis Referring Provider: Dr. Katy Apo  Assessment: Although only rare colonies of Mycobacterium abscessus were isolated from her vitreal culture I believe that this probably represents true , which is extremely rare.  I cannot explain why her vision seemed to be getting better after receiving vancomycin and ceftazidime but the fact that she has had even more significant improvement following amikacin makes sense as Mycobacterium abscessus is usually amikacin susceptible.  Mycobacterium abscessus tends to be multidrug-resistant and predicting which drugs are likely to work is difficult.  There are some published case reports and case series but these do not offer much help in determining optimal therapy.  Other agents with fairly predictable activity against Mycobacterium abscessus include cefoxitin, clofazimine and clarithromycin.  She very quickly ruled out the possibility of intravenous antibiotics because of her phobia about antibiotics.  I recommended that we consider using a combination of intravitreal amikacin, intravitreal cefoxitin and oral clarithromycin and clofazimine pending antibiotic susceptibility results.  She is willing to receive the intravitreal antibiotics but says that she would like to talk about the oral antibiotics with her PCP, Dr. Samara Snide, before making a final decision.  I have asked our ID pharmacist to help determine an appropriate intravitreal dose of cefoxitin.  She is also going to go ahead and apply for clofazimine.  I spoke with Dr. Baird Cancer today to discuss the case.  He is considering weekly doses of intravitreal antibiotic(s).  Recommend: 1. Consider weekly intravitreal amikacin and cefoxitin 2. Clarithromycin 500 mg twice daily and clofazimine 100 mg daily 3. Await antibiotic susceptibility results 4. I will follow-up with her next week   Patient  Active Problem List   Diagnosis Date Noted  . Endophthalmitis, acute, left 01/12/2020    Priority: High  . 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 Anxiety 01/12/2020  . IBS (irritable bowel syndrome) 01/12/2020  . Mitral valve prolapse 01/12/2020  . S/P cataract extraction and insertion of intraocular lens, left 01/12/2020  . Multilevel degenerative disc disease 01/12/2020    Patient's Medications  New Prescriptions   CLARITHROMYCIN (BIAXIN) 250 MG/5ML SUSPENSION    Take 10 mLs (500 mg total) by mouth 2 (two) times daily.   CLOFAZIMINE POWD    Take 100 mg by mouth daily.  Previous Medications   CALCIUM CARBONATE-VITAMIN D (CALCIUM + D PO)    Take 1 tablet by mouth daily.   DIFLUPREDNATE 0.05 % EMUL    Apply to eye.   GENTAMICIN SULFATE (GENTAMICIN FORTIFIED OPHTHALMIC SOLUTION)    Apply to eye.   IBUPROFEN (ADVIL,MOTRIN) 800 MG TABLET    Take 1 tablet (800 mg total) by mouth 3 (three) times daily.   LORAZEPAM (ATIVAN) 1 MG TABLET    Take 1 mg by mouth every 8 (eight) hours. For anxiety   MULTIPLE VITAMIN (DAILY VITAMINS PO)    Take 15 mLs by mouth daily.   PROPRANOLOL (INDERAL) 10 MG TABLET    Take 5-10 mg by mouth 3 (three) times daily. Takes 10 mg first two doses and then 5 mg last dose.   SALINE (SIMPLY SALINE) 0.9 % AERS    Place 1 spray into the nose 2 (two) times daily.   Modified Medications   No medications on file  Discontinued Medications   No medications on file    HPI: Alisha Henderson is a 69 y.o. female who underwent left eye  cataract extraction and intraocular lens placement on 11/28/2019.  About 2 weeks postop she began to notice pain, excessive tearing, a gritty sensation and blurred vision in her left eye.  She tells me that a second procedure was done to remove a "fragment".  She was referred to Dr. Sherlynn Stalls, a retinal specialist on 12/30/2019.  He aspirated her eye for culture and gave her doses of intravitreal vancomycin and  ceftazidime.  She followed up 1 day later and got a second dose of intravitreal vancomycin.  The culture grew rare Mycobacterium abscessus.  Antibiotic susceptibilities are pending.  Upon follow-up with Dr. Baird Cancer on 01/09/2020 he noted that her eye looked better and her vision was improving.  He gave her an intravitreal dose of amikacin.  She has also been using gentamicin and difluprednate eyedrops.  She has also seen Dr. Midge Aver for a second ophthalmology opinion.  She says that she still has a circular area of blurred vision just to the left of midline in her left eye.  However, she says that her vision has improved from 20/200 to 20/30 recently.  The stabbing pain and excessive tearing have resolved.  She tells me that she has a "phobia" about antibiotics.  She says it when she was 69 years old she developed anaphylaxis after receiving penicillin.  She recalls waking up on the floor hearing her mother scream "she is going to die".  She says that she is also allergic to sulfa antibiotics and ciprofloxacin but is not sure what reaction she had.  As a result of her phobia she has been unwilling to take systemic antibiotics for her endophthalmitis.  He tells me that her husband died suddenly and unexpectedly 2 years ago.  She says that she would afraid to take antibiotics if she were home alone, fearing that she might have some severe reaction and no one would know about it.  She is hoping to return to work soon.  She works in Programmer, applications for [employer].  She says that she is normally very active and walks several miles daily.  She tells me repeatedly that she is very appreciative of the care she has gotten from all of her eye doctors.  Review of Systems: Review of Systems  Constitutional: Positive for diaphoresis, malaise/fatigue and weight loss. Negative for chills and fever.       She says that she has not been eating well and has lost about 3 pounds recently.  She  attributes this to severe anxiety.  HENT: Negative for congestion and sore throat.   Eyes: Positive for blurred vision and pain. Negative for double vision, photophobia, discharge and redness.  Respiratory: Negative for cough and shortness of breath.   Cardiovascular: Negative for chest pain.  Gastrointestinal: Positive for diarrhea. Negative for abdominal pain, nausea and vomiting.       She says that her chronic IBS symptoms have been worse recently due to her increased anxiety.  Musculoskeletal: Positive for back pain, joint pain and neck pain.  Neurological: Positive for headaches.  Psychiatric/Behavioral: Negative for depression. The patient is nervous/anxious.       Past Medical History:  Diagnosis Date  . Degenerative disc disease, lumbar   . Mitral valve prolapse     Social History   Tobacco Use  . Smoking status: Never Smoker  . Smokeless tobacco: Never Used  Substance Use Topics  . Alcohol use: No  . Drug use: No    No family history on  file. Allergies  Allergen Reactions  . Sulfa Antibiotics Anaphylaxis  . Ciprofloxacin   . Codeine Nausea And Vomiting  . Other     Antibiotics cause anaphylaxis  . Penicillins     Has patient had a PCN reaction causing immediate rash, facial/tongue/throat swelling, SOB or lightheadedness with hypotension: Yes- Anaphylaxis  Has patient had a PCN reaction causing severe rash involving mucus membranes or skin necrosis: No Has patient had a PCN reaction that required hospitalization No Has patient had a PCN reaction occurring within the last 10 years: No If all of the above answers are "NO", then may proceed with Cephalosporin use.     OBJECTIVE: Vitals:   01/12/20 0956  BP: (!) 132/53  Pulse: 62  Temp: 98.4 F (36.9 C)  TempSrc: Oral  Weight: 122 lb (55.3 kg)   There is no height or weight on file to calculate BMI.   Physical Exam Constitutional:      General: She is not in acute distress.    Appearance: Normal  appearance.     Comments: She is talkative and very pleasant.  Her sense of humor is intact.  Eyes:     General:        Left eye: No discharge.     Comments: She has some slight conjunctival redness in her left eye.  Her left pupil seems sluggish.  Cardiovascular:     Rate and Rhythm: Normal rate and regular rhythm.     Heart sounds: No murmur.  Pulmonary:     Effort: Pulmonary effort is normal.     Breath sounds: Normal breath sounds.  Abdominal:     Palpations: Abdomen is soft.     Tenderness: There is no abdominal tenderness.  Musculoskeletal:        General: No swelling or tenderness.  Skin:    Findings: No rash.  Neurological:     General: No focal deficit present.     Mental Status: She is alert.  Psychiatric:        Mood and Affect: Mood normal.     Comments: She talks a lot about being quite anxious but the concern she expresses seem quite reasonable.  She has very thoughtful and asks good questions.     Microbiology: No results found for this or any previous visit (from the past 240 hour(s)).  Michel Bickers, MD [facility] [phone] pager   [phone] cell 01/12/2020, 2:10 PM

## 2020-01-15 ENCOUNTER — Telehealth: Payer: Self-pay | Admitting: Ophthalmology

## 2020-01-15 DIAGNOSIS — R002 Palpitations: Secondary | ICD-10-CM | POA: Diagnosis not present

## 2020-01-15 DIAGNOSIS — F419 Anxiety disorder, unspecified: Secondary | ICD-10-CM | POA: Diagnosis not present

## 2020-01-15 DIAGNOSIS — H44002 Unspecified purulent endophthalmitis, left eye: Secondary | ICD-10-CM | POA: Diagnosis not present

## 2020-01-16 NOTE — Telephone Encounter (Signed)
I talked to her again this morning and she is, grudgingly, willing to try oral clarithromycin and clofazimine with intravitreal amikacin.  This infection is going to be extremely difficult to eradicate.  I asked the lab at [facility] to add some extra susceptibilities to some new oral agents but those results will probably not be available for several weeks.

## 2020-01-17 ENCOUNTER — Telehealth: Payer: Self-pay | Admitting: Pharmacist

## 2020-01-17 DIAGNOSIS — H44002 Unspecified purulent endophthalmitis, left eye: Secondary | ICD-10-CM | POA: Diagnosis not present

## 2020-01-17 NOTE — Telephone Encounter (Signed)
Completed online application for clofazimine with [employer] . Will update Dr. Campbell when approval status has been decided.  

## 2020-01-21 DIAGNOSIS — R634 Abnormal weight loss: Secondary | ICD-10-CM | POA: Diagnosis not present

## 2020-01-21 DIAGNOSIS — R600 Localized edema: Secondary | ICD-10-CM | POA: Diagnosis not present

## 2020-01-23 NOTE — Telephone Encounter (Signed)
Patient is approved to receive clofazimine for mycobacterium abscessus endophthalmitis infection. Medication should arrive to clinic in 7-10 business days. Will alert [name] of approval status.

## 2020-01-23 NOTE — Telephone Encounter (Signed)
Much thanks.

## 2020-01-24 ENCOUNTER — Telehealth: Payer: Self-pay | Admitting: Internal Medicine

## 2020-01-24 DIAGNOSIS — H44002 Unspecified purulent endophthalmitis, left eye: Secondary | ICD-10-CM | POA: Diagnosis not present

## 2020-01-24 NOTE — Telephone Encounter (Signed)
I spoke to Dr. Sherlynn Stalls [phone] cell, [phone] office) today.  He is continuing weekly intravitreal injections of amikacin for Alisha Henderson.  He says that she still has some infiltrates lining her cornea but overall the inflammation seems to be improving.  I spoke to Alisha Henderson as well today.  She is having a little bit of nausea with clarithromycin but no vomiting.  She says that it is manageable.  She says that her vision has improved significantly.  I let her know that clofazimine has been approved but we are awaiting shipment.  I told her that I would call her immediately once we have it in hand.

## 2020-01-25 DIAGNOSIS — L309 Dermatitis, unspecified: Secondary | ICD-10-CM | POA: Diagnosis not present

## 2020-01-25 NOTE — Telephone Encounter (Addendum)
Patient's clofazimine arrived to clinic today 01/25/20. Called patient to set up appointment with me for next week so that she can come in and pick up medication and receive counseling. No answer, left HIPAA compliant message.

## 2020-01-29 DIAGNOSIS — R002 Palpitations: Secondary | ICD-10-CM | POA: Diagnosis not present

## 2020-01-29 DIAGNOSIS — F419 Anxiety disorder, unspecified: Secondary | ICD-10-CM | POA: Diagnosis not present

## 2020-01-29 DIAGNOSIS — F320 Major depressive disorder, single episode, mild: Secondary | ICD-10-CM | POA: Diagnosis not present

## 2020-01-31 ENCOUNTER — Ambulatory Visit (INDEPENDENT_AMBULATORY_CARE_PROVIDER_SITE_OTHER): Payer: Medicare Other | Admitting: Pharmacist

## 2020-01-31 ENCOUNTER — Other Ambulatory Visit: Payer: Self-pay

## 2020-01-31 DIAGNOSIS — A319 Mycobacterial infection, unspecified: Secondary | ICD-10-CM

## 2020-01-31 DIAGNOSIS — H44002 Unspecified purulent endophthalmitis, left eye: Secondary | ICD-10-CM | POA: Diagnosis not present

## 2020-01-31 DIAGNOSIS — A318 Other mycobacterial infections: Secondary | ICD-10-CM

## 2020-01-31 NOTE — Progress Notes (Signed)
HPI: Ranique Henderson is a 69 y.o. female who presents to the [facility] for medication counseling.  Patient Active Problem List   Diagnosis Date Noted  . Endophthalmitis, acute, left 01/12/2020  . 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 Anxiety 01/12/2020  . IBS (irritable bowel syndrome) 01/12/2020  . Mitral valve prolapse 01/12/2020  . S/P cataract extraction and insertion of intraocular lens, left 01/12/2020  . Multilevel degenerative disc disease 01/12/2020    Patient's Medications  New Prescriptions   No medications on file  Previous Medications   CALCIUM CARBONATE-VITAMIN D (CALCIUM + D PO)    Take 1 tablet by mouth daily.   CLARITHROMYCIN (BIAXIN) 250 MG/5ML SUSPENSION    Take 10 mLs (500 mg total) by mouth 2 (two) times daily.   CLOFAZIMINE POWD    Take 100 mg by mouth daily.   DIFLUPREDNATE 0.05 % EMUL    Apply to eye.   GENTAMICIN SULFATE (GENTAMICIN FORTIFIED OPHTHALMIC SOLUTION)    Apply to eye.   IBUPROFEN (ADVIL,MOTRIN) 800 MG TABLET    Take 1 tablet (800 mg total) by mouth 3 (three) times daily.   LORAZEPAM (ATIVAN) 1 MG TABLET    Take 1 mg by mouth every 8 (eight) hours. For anxiety   MULTIPLE VITAMIN (DAILY VITAMINS PO)    Take 15 mLs by mouth daily.   PROPRANOLOL (INDERAL) 10 MG TABLET    Take 5-10 mg by mouth 3 (three) times daily. Takes 10 mg first two doses and then 5 mg last dose.   SALINE (SIMPLY SALINE) 0.9 % AERS    Place 1 spray into the nose 2 (two) times daily.   Modified Medications   No medications on file  Discontinued Medications   No medications on file    Allergies: Allergies  Allergen Reactions  . Sulfa Antibiotics Anaphylaxis  . Ciprofloxacin   . Codeine Nausea And Vomiting  . Other     Antibiotics cause anaphylaxis  . Penicillins     Has patient had a PCN reaction causing immediate rash, facial/tongue/throat swelling, SOB or lightheadedness with hypotension: Yes- Anaphylaxis  Has patient had a PCN reaction causing  severe rash involving mucus membranes or skin necrosis: No Has patient had a PCN reaction that required hospitalization No Has patient had a PCN reaction occurring within the last 10 years: No If all of the above answers are "NO", then may proceed with Cephalosporin use.     Past Medical History: Past Medical History:  Diagnosis Date  . Degenerative disc disease, lumbar   . Mitral valve prolapse     Social History: Social History   Socioeconomic History  . Marital status: Single    Spouse name: Not on file  . Number of children: Not on file  . Years of education: Not on file  . Highest education level: Not on file  Occupational History  . Not on file  Tobacco Use  . Smoking status: Never Smoker  . Smokeless tobacco: Never Used  Substance and Sexual Activity  . Alcohol use: No  . Drug use: No  . Sexual activity: Not on file  Other Topics Concern  . Not on file  Social History Narrative  . Not on file   Social Determinants of Health   Financial Resource Strain:   . Difficulty of Paying Living Expenses: Not on file  Food Insecurity:   . Worried About Charity fundraiser in the Last Year: Not on file  . Ran Out of Food  in the Last Year: Not on file  Transportation Needs:   . Lack of Transportation (Medical): Not on file  . Lack of Transportation (Non-Medical): Not on file  Physical Activity:   . Days of Exercise per Week: Not on file  . Minutes of Exercise per Session: Not on file  Stress:   . Feeling of Stress : Not on file  Social Connections:   . Frequency of Communication with Friends and Family: Not on file  . Frequency of Social Gatherings with Friends and Family: Not on file  . Attends Religious Services: Not on file  . Active Member of Clubs or Organizations: Not on file  . Attends Archivist Meetings: Not on file  . Marital Status: Not on file    Assessment: Alisha Henderson is here today for medication counseling and pick up.  She is currently  taking intravitreal amikacin and clarithromycin for her Mycobacterium abscess endophthalmitis. She states that she is having some issues with her clarithromycin. It is hurting her stomach and "burns" her throat. She isn't having any metallic taste with it but is having some nausea. She does suffer from IBS and has stomach issues anyway.  She is also having burning in her eye after her eye drops.  She goes for another intravitreal dose of amikacin today following this appointment. I was able to get her clofazimine from [employer]. She is a little hesitant with taking it, but knows that it is necessary.   Counseled patient to take TWO clofazimine capsules together once daily. Advised patient not to separate capsules and to make sure to take them together. Explained that the tablets are 50 mg each but the dose needed is 100 mg, therefore the need to take two capsules together. Encouraged patient not to miss any doses and to continue taking until discontinued by [name].  Counseled patient on what to do if dose is missed - if it is closer to the missed dose take immediately; if closer to next dose skip dose and take the next dose at the usual time. Never double up doses.  Counseled patient that side effects are usually observed on higher doses but that common side effects include GI upset with nausea and diarrhea. Taking clofazimine with food may help the upset stomach, and it should also be taken with a full glass of water. Patient was advised to call if nausea or diarrhea continue to be an issue.  Also counseled patient that medication can darken skin and other secretions such as tears, saliva, and urine. Advised patient to avoid direct sun exposure while on clofazimine as the medication can increase sun sensitivity.  Asked that the patient wear sunscreen, a hat, and long sleeves while outside.  Other common side effects include skin dryness and liver toxicity but advised patient that we will be  monitoring her liver throughout therapy. All side effects tend to resolve after discontinuation of clofazimine. Patient will call me if any issues arise and will follow up with [name] next week.  Plan: - Start clofazimine 100 mg PO once daily - F/u with [name] on 3/9 at 230pm  Linville Decarolis L. Naftali Carchi, PharmD, BCIDP, AAHIVP, CPP Clinical Pharmacist Practitioner Infectious Diseases [facility] 01/31/2020, 4:35 PM

## 2020-02-06 ENCOUNTER — Other Ambulatory Visit: Payer: Self-pay

## 2020-02-06 ENCOUNTER — Ambulatory Visit: Payer: Medicare Other | Admitting: Internal Medicine

## 2020-02-06 DIAGNOSIS — H44002 Unspecified purulent endophthalmitis, left eye: Secondary | ICD-10-CM

## 2020-02-07 ENCOUNTER — Telehealth: Payer: Self-pay | Admitting: Internal Medicine

## 2020-02-07 DIAGNOSIS — H44002 Unspecified purulent endophthalmitis, left eye: Secondary | ICD-10-CM | POA: Diagnosis not present

## 2020-02-07 NOTE — Progress Notes (Addendum)
[facility]  Patient Active Problem List   Diagnosis Date Noted  . Endophthalmitis, acute, left 01/12/2020    Priority: High  . 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 Anxiety 01/12/2020  . IBS (irritable bowel syndrome) 01/12/2020  . Mitral valve prolapse 01/12/2020  . S/P cataract extraction and insertion of intraocular lens, left 01/12/2020  . Multilevel degenerative disc disease 01/12/2020    Patient's Medications  New Prescriptions   No medications on file  Previous Medications   CALCIUM CARBONATE-VITAMIN D (CALCIUM + D PO)    Take 1 tablet by mouth daily.   CLARITHROMYCIN (BIAXIN) 250 MG/5ML SUSPENSION    Take 10 mLs (500 mg total) by mouth 2 (two) times daily.   CLOFAZIMINE POWD    Take 100 mg by mouth daily.   DIFLUPREDNATE 0.05 % EMUL    Apply to eye.   GENTAMICIN SULFATE (GENTAMICIN FORTIFIED OPHTHALMIC SOLUTION)    Apply to eye.   IBUPROFEN (ADVIL,MOTRIN) 800 MG TABLET    Take 1 tablet (800 mg total) by mouth 3 (three) times daily.   LORAZEPAM (ATIVAN) 1 MG TABLET    Take 1 mg by mouth every 8 (eight) hours. For anxiety   MULTIPLE VITAMIN (DAILY VITAMINS PO)    Take 15 mLs by mouth daily.   PROPRANOLOL (INDERAL) 10 MG TABLET    Take 5-10 mg by mouth 3 (three) times daily. Takes 10 mg first two doses and then 5 mg last dose.   SALINE (SIMPLY SALINE) 0.9 % AERS    Place 1 spray into the nose 2 (two) times daily.   Modified Medications   No medications on file  Discontinued Medications   No medications on file    Subjective: Ms. Alisha Henderson is in for her routine follow-up visit.  She underwent left eye cataract extraction and intraocular lens placement on 11/28/2019.  About 2 weeks postop she began to notice pain, excessive tearing, a gritty sensation and blurred vision in her left eye.  She tells me that a second procedure was done to remove a "fragment".  She was referred to Dr. Sherlynn Stalls, a retinal  specialist on 12/30/2019.  He aspirated her eye for culture and gave her doses of intravitreal vancomycin and ceftazidime.  She followed up 1 day later and got a second dose of intravitreal vancomycin.  The culture grew rare Mycobacterium abscessus.  Upon follow-up with Dr. Baird Cancer on 01/09/2020 he noted that her eye looked better and her vision was improving.  He gave her an intravitreal dose of amikacin.  She has also been using gentamicin and difluprednate eyedrops.    When I first saw her on 01/12/2020 she described having a phobia about taking systemic antibiotics.  She has been very reluctant to start new agents because of fear of potential side effects.  I was finally able to convince her to start clarithromycin around 01/16/2020.  She met with my ID pharmacist, Cassie Kuppelweiser, 1 week ago and was given a supply of clofazimine and instructions about how to take it.  Unfortunately she says that she is already finished with clarithromycin (although she should not be out this quickly and she has refills) and she decided not to start clofazimine after her visit last week.  I spoke to the microbiology lab at [facility] on 01/15/2020 and requested additional antibiotic susceptibility testing for omadacycline and bedaquiline.  I spoke to the lab again this morning.  Antibiotic susceptibilities are still pending.  I asked that the lab manager call me as soon as possible.  She describes debilitating anxiety and worsening depression.  She says that she was seen by her PCP recently and started on an antidepressant.  She is not feeling any better.  She went to [facility] urgent care this past weekend after she woke up with a nosebleed.  No changes were made in her medications.  She says she has developed redness, swelling and burning pain in her feet.  This usually occurs at night before going to bed.   She has been receiving weekly intravitreal injections of amikacin from Dr. Baird Cancer.  She states that her vision is no  worse but no better.  She says that she has no appetite and that she has lost 10 pounds recently.  Review of Systems: Review of Systems  Constitutional: Positive for malaise/fatigue and weight loss. Negative for chills, diaphoresis and fever.  HENT: Positive for nosebleeds. Negative for congestion and sore throat.   Eyes: Positive for blurred vision and pain.  Respiratory: Negative for cough.   Cardiovascular: Negative for chest pain.  Gastrointestinal: Positive for nausea. Negative for abdominal pain, diarrhea and vomiting.  Skin: Negative for rash.  Psychiatric/Behavioral: Positive for depression. The patient is nervous/anxious.     Past Medical History:  Diagnosis Date  . Degenerative disc disease, lumbar   . Mitral valve prolapse     Social History   Tobacco Use  . Smoking status: Never Smoker  . Smokeless tobacco: Never Used  Substance Use Topics  . Alcohol use: No  . Drug use: No    No family history on file.  Allergies  Allergen Reactions  . Sulfa Antibiotics Anaphylaxis  . Ciprofloxacin   . Codeine Nausea And Vomiting  . Other     Antibiotics cause anaphylaxis  . Penicillins     Has patient had a PCN reaction causing immediate rash, facial/tongue/throat swelling, SOB or lightheadedness with hypotension: Yes- Anaphylaxis  Has patient had a PCN reaction causing severe rash involving mucus membranes or skin necrosis: No Has patient had a PCN reaction that required hospitalization No Has patient had a PCN reaction occurring within the last 10 years: No If all of the above answers are "NO", then may proceed with Cephalosporin use.     Objective: Vitals:   02/06/20 1431  BP: 126/64  Pulse: 67  Temp: 98.2 F (36.8 C)  TempSrc: Oral  Weight: 123 lb 9.6 oz (56.1 kg)   There is no height or weight on file to calculate BMI.  Physical Exam Constitutional:      Comments: She is pleasant and calm but extremely anxious.  Eyes:     Conjunctiva/sclera:  Conjunctivae normal.     Pupils: Pupils are equal, round, and reactive to light.  Cardiovascular:     Rate and Rhythm: Normal rate and regular rhythm.     Heart sounds: No murmur.  Pulmonary:     Effort: Pulmonary effort is normal.     Breath sounds: Normal breath sounds.  Abdominal:     Palpations: Abdomen is soft.     Tenderness: There is no abdominal tenderness.  Musculoskeletal:        General: No swelling or tenderness.     Right lower leg: No edema.     Left lower leg: No edema.     Comments: She has no edema, swelling, redness or other noticeable abnormalities of her legs and feet.  Psychiatric:  Comments: Very anxious and distracted.  I had to go over my records mentations and rationale for them repeatedly.  Within 5 minutes of checking out today she called me stating she did understand what she was supposed to do.     Lab Results She refused to have blood work drawn for CBC and BMP today stating that she had blood work drawn during her routine physical 8 weeks ago.   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  I tried to gently emphasize that there is no shortcut for this type of severe infection.  Any chance to cure the infection without enucleation is likely to require combinations of multiple antibiotics for a prolonged period of time.  Unfortunately her history, debilitating anxiety and depression are making it extremely difficult for her to comprehend that.  I will continue to encourage her to take clarithromycin and clofazimine together while continuing weekly amikacin injections pending final results of antibiotic susceptibility testing.  Addendum: I did call the [facility] microbiology lab and was able to obtain preliminary antibiotic susceptibility testing  results today.  Her Mycobacterium abscessus isolate is fully susceptible to amikacin and clarithromycin.  Susceptibilities to clofazimine, omadacycline and bedaquiline have not been reported yet.  I asked the microbiology tech to please call the [facility] where the tests are being performed to check on the status of these results.  I think that continuing intravitreal amikacin, topical gentamicin, oral clarithromycin and clofazimine is the best antibiotic regimen available at this time.  I discussed the situation with Dr. Sherlynn Stalls this afternoon and he is in agreement with that plan.  I also called Ms. Hebenstreit this afternoon.  She was much more calmer and appears much more comfortable with this recommendation.  I asked her to call me right away if she has any problems or concerns between now and her next visit.          Michel Bickers, MD [facility] for [facility] [phone] pager   [phone] cell 02/07/2020, 5:33 PM

## 2020-02-07 NOTE — Assessment & Plan Note (Addendum)
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  I tried to gently emphasize that there is no shortcut for this type of severe infection.  Any chance to cure the infection without enucleation is likely to require combinations of multiple antibiotics for a prolonged period of time.  Unfortunately her history, debilitating anxiety and depression are making it extremely difficult for her to comprehend that.  I will continue to encourage her to take clarithromycin and clofazimine together while continuing weekly amikacin injections pending final results of antibiotic susceptibility testing.  Addendum: I did call the [facility] microbiology lab and was able to obtain preliminary antibiotic susceptibility testing results today.  Her Mycobacterium abscessus isolate is fully susceptible to amikacin and clarithromycin.  Susceptibilities to clofazimine, omadacycline and bedaquiline have not been reported yet.  I asked the microbiology tech to please call the [facility] where the tests are being performed to check on the status of these results.  I think that continuing intravitreal amikacin, topical gentamicin, oral clarithromycin and clofazimine is the best antibiotic regimen available at this time.  I discussed the situation with Dr. Sherlynn Stalls this afternoon and he is in agreement with that plan.  I also called Ms. Kucinski this afternoon.  She was much more calmer and appears much more comfortable with this recommendation.  I asked her to call me right away if she has any problems or concerns between now and her next visit.

## 2020-02-14 DIAGNOSIS — H44002 Unspecified purulent endophthalmitis, left eye: Secondary | ICD-10-CM | POA: Diagnosis not present

## 2020-02-14 DIAGNOSIS — H16102 Unspecified superficial keratitis, left eye: Secondary | ICD-10-CM | POA: Diagnosis not present

## 2020-02-16 DIAGNOSIS — H44002 Unspecified purulent endophthalmitis, left eye: Secondary | ICD-10-CM | POA: Diagnosis not present

## 2020-02-16 DIAGNOSIS — F419 Anxiety disorder, unspecified: Secondary | ICD-10-CM | POA: Diagnosis not present

## 2020-02-16 DIAGNOSIS — R002 Palpitations: Secondary | ICD-10-CM | POA: Diagnosis not present

## 2020-02-21 DIAGNOSIS — H44002 Unspecified purulent endophthalmitis, left eye: Secondary | ICD-10-CM | POA: Diagnosis not present

## 2020-02-26 DIAGNOSIS — H44002 Unspecified purulent endophthalmitis, left eye: Secondary | ICD-10-CM | POA: Diagnosis not present

## 2020-02-29 DIAGNOSIS — H44002 Unspecified purulent endophthalmitis, left eye: Secondary | ICD-10-CM | POA: Diagnosis not present

## 2020-03-06 DIAGNOSIS — H44002 Unspecified purulent endophthalmitis, left eye: Secondary | ICD-10-CM | POA: Diagnosis not present

## 2020-03-08 DIAGNOSIS — H44002 Unspecified purulent endophthalmitis, left eye: Secondary | ICD-10-CM | POA: Diagnosis not present

## 2020-03-15 DIAGNOSIS — H44002 Unspecified purulent endophthalmitis, left eye: Secondary | ICD-10-CM | POA: Diagnosis not present

## 2020-03-18 ENCOUNTER — Telehealth: Payer: Self-pay | Admitting: Internal Medicine

## 2020-03-18 ENCOUNTER — Ambulatory Visit: Payer: BC Managed Care – PPO | Admitting: Internal Medicine

## 2020-03-18 NOTE — Telephone Encounter (Signed)
Alisha Henderson canceled her appointment with me today.  I called to check on her.  She told me that she had a very difficult time last week and simply could not make the appointment today.  She went to [facility] and saw Dr. Helane Rima for another ophthalmology opinion on 03/11/2020.  He recommended that she start to come off of her antibiotics.  I spoke to Dr. Sherlynn Stalls last week.  He said that her vision was improving but that she still had some spots on her cornea.  He recommended increasing the amikacin injections to twice weekly.  Alisha Henderson did not want to do that and has now changed her local retinal care to Dr. Jalene Mullet.  She tells me that he will be continuing amikacin injections but does not know when she will get her next injection.  She says that she is continuing to take her oral clarithromycin and clofazimine but is bothered with lots of nausea and worsening of her IBS.  She says that she has lost 13 pounds because she simply cannot eat.  She said that she did not want to try taking any antiemetics.  I asked her to let us know when she is available to reschedule an appointment with me.

## 2020-04-03 ENCOUNTER — Telehealth: Payer: Self-pay | Admitting: Internal Medicine

## 2020-04-03 NOTE — Telephone Encounter (Signed)
I received a phone call from Dr. Armanda Heritage on 04/01/2020.  He informed me that his plan was to continue intravitreal amikacin and to perform a vitrectomy for Alisha Henderson's smoldering Mycobacterium abscessus endophthalmitis.  I told him that she had not kept her last appointment with me and has not rescheduled although I called and asked her to do so.  I told him that I am not certain that she is taking her clarithromycin or clofazimine.  He said that he would ask her to reschedule as soon as possible.

## 2020-04-24 DIAGNOSIS — H35371 Puckering of macula, right eye: Secondary | ICD-10-CM | POA: Diagnosis not present

## 2020-04-24 DIAGNOSIS — H44002 Unspecified purulent endophthalmitis, left eye: Secondary | ICD-10-CM | POA: Diagnosis not present

## 2020-04-30 DIAGNOSIS — H44002 Unspecified purulent endophthalmitis, left eye: Secondary | ICD-10-CM | POA: Diagnosis not present

## 2020-05-01 DIAGNOSIS — H16002 Unspecified corneal ulcer, left eye: Secondary | ICD-10-CM | POA: Diagnosis not present

## 2020-05-02 DIAGNOSIS — H44002 Unspecified purulent endophthalmitis, left eye: Secondary | ICD-10-CM | POA: Diagnosis not present

## 2020-05-06 DIAGNOSIS — S90811A Abrasion, right foot, initial encounter: Secondary | ICD-10-CM | POA: Diagnosis not present

## 2020-05-10 DIAGNOSIS — H16002 Unspecified corneal ulcer, left eye: Secondary | ICD-10-CM | POA: Diagnosis not present

## 2020-05-13 DIAGNOSIS — H44002 Unspecified purulent endophthalmitis, left eye: Secondary | ICD-10-CM | POA: Diagnosis not present

## 2020-05-15 DIAGNOSIS — H44002 Unspecified purulent endophthalmitis, left eye: Secondary | ICD-10-CM | POA: Diagnosis not present

## 2020-05-17 DIAGNOSIS — L231 Allergic contact dermatitis due to adhesives: Secondary | ICD-10-CM | POA: Diagnosis not present

## 2020-05-20 DIAGNOSIS — Z9889 Other specified postprocedural states: Secondary | ICD-10-CM | POA: Diagnosis not present

## 2020-05-20 DIAGNOSIS — H16002 Unspecified corneal ulcer, left eye: Secondary | ICD-10-CM | POA: Diagnosis not present

## 2020-05-20 DIAGNOSIS — Z79899 Other long term (current) drug therapy: Secondary | ICD-10-CM | POA: Diagnosis not present

## 2020-05-20 DIAGNOSIS — H16392 Other interstitial and deep keratitis, left eye: Secondary | ICD-10-CM | POA: Diagnosis not present

## 2020-05-24 DIAGNOSIS — H35371 Puckering of macula, right eye: Secondary | ICD-10-CM | POA: Diagnosis not present

## 2020-05-24 DIAGNOSIS — H44002 Unspecified purulent endophthalmitis, left eye: Secondary | ICD-10-CM | POA: Diagnosis not present

## 2020-05-29 DIAGNOSIS — H44002 Unspecified purulent endophthalmitis, left eye: Secondary | ICD-10-CM | POA: Diagnosis not present

## 2020-06-05 DIAGNOSIS — H44002 Unspecified purulent endophthalmitis, left eye: Secondary | ICD-10-CM | POA: Diagnosis not present

## 2020-06-08 DIAGNOSIS — L259 Unspecified contact dermatitis, unspecified cause: Secondary | ICD-10-CM | POA: Diagnosis not present

## 2020-06-10 DIAGNOSIS — H16002 Unspecified corneal ulcer, left eye: Secondary | ICD-10-CM | POA: Diagnosis not present

## 2020-06-10 DIAGNOSIS — Z79899 Other long term (current) drug therapy: Secondary | ICD-10-CM | POA: Diagnosis not present

## 2020-06-14 DIAGNOSIS — H16002 Unspecified corneal ulcer, left eye: Secondary | ICD-10-CM | POA: Diagnosis not present

## 2020-06-20 DIAGNOSIS — H44002 Unspecified purulent endophthalmitis, left eye: Secondary | ICD-10-CM | POA: Diagnosis not present

## 2020-06-20 DIAGNOSIS — H35371 Puckering of macula, right eye: Secondary | ICD-10-CM | POA: Diagnosis not present

## 2020-06-26 DIAGNOSIS — L308 Other specified dermatitis: Secondary | ICD-10-CM | POA: Diagnosis not present

## 2020-06-27 DIAGNOSIS — H44002 Unspecified purulent endophthalmitis, left eye: Secondary | ICD-10-CM | POA: Diagnosis not present

## 2020-06-29 DIAGNOSIS — R609 Edema, unspecified: Secondary | ICD-10-CM | POA: Diagnosis not present

## 2020-06-29 DIAGNOSIS — M79641 Pain in right hand: Secondary | ICD-10-CM | POA: Diagnosis not present

## 2020-07-01 DIAGNOSIS — L089 Local infection of the skin and subcutaneous tissue, unspecified: Secondary | ICD-10-CM | POA: Diagnosis not present

## 2020-07-04 DIAGNOSIS — H44002 Unspecified purulent endophthalmitis, left eye: Secondary | ICD-10-CM | POA: Diagnosis not present

## 2020-07-10 DIAGNOSIS — H44002 Unspecified purulent endophthalmitis, left eye: Secondary | ICD-10-CM | POA: Diagnosis not present

## 2020-07-17 DIAGNOSIS — H44002 Unspecified purulent endophthalmitis, left eye: Secondary | ICD-10-CM | POA: Diagnosis not present

## 2020-07-24 DIAGNOSIS — H44002 Unspecified purulent endophthalmitis, left eye: Secondary | ICD-10-CM | POA: Diagnosis not present

## 2020-07-24 DIAGNOSIS — H35371 Puckering of macula, right eye: Secondary | ICD-10-CM | POA: Diagnosis not present

## 2020-07-26 DIAGNOSIS — K589 Irritable bowel syndrome without diarrhea: Secondary | ICD-10-CM | POA: Diagnosis not present

## 2020-07-26 DIAGNOSIS — R002 Palpitations: Secondary | ICD-10-CM | POA: Diagnosis not present

## 2020-07-26 DIAGNOSIS — F419 Anxiety disorder, unspecified: Secondary | ICD-10-CM | POA: Diagnosis not present

## 2020-08-02 DIAGNOSIS — H44002 Unspecified purulent endophthalmitis, left eye: Secondary | ICD-10-CM | POA: Diagnosis not present

## 2020-08-12 DIAGNOSIS — H44002 Unspecified purulent endophthalmitis, left eye: Secondary | ICD-10-CM | POA: Diagnosis not present

## 2020-08-12 DIAGNOSIS — H35371 Puckering of macula, right eye: Secondary | ICD-10-CM | POA: Diagnosis not present

## 2020-08-26 DIAGNOSIS — H44002 Unspecified purulent endophthalmitis, left eye: Secondary | ICD-10-CM | POA: Diagnosis not present

## 2020-08-28 DIAGNOSIS — H16002 Unspecified corneal ulcer, left eye: Secondary | ICD-10-CM | POA: Diagnosis not present

## 2020-09-02 DIAGNOSIS — S8002XA Contusion of left knee, initial encounter: Secondary | ICD-10-CM | POA: Diagnosis not present

## 2020-09-02 DIAGNOSIS — S161XXA Strain of muscle, fascia and tendon at neck level, initial encounter: Secondary | ICD-10-CM | POA: Diagnosis not present

## 2020-09-02 DIAGNOSIS — S20214A Contusion of middle front wall of thorax, initial encounter: Secondary | ICD-10-CM | POA: Diagnosis not present

## 2020-09-03 ENCOUNTER — Other Ambulatory Visit: Payer: Self-pay | Admitting: Family Medicine

## 2020-09-03 ENCOUNTER — Ambulatory Visit
Admission: RE | Admit: 2020-09-03 | Discharge: 2020-09-03 | Disposition: A | Discharge status: Home / Self Care | Payer: BC Managed Care – PPO | Source: Ambulatory Visit | Attending: Family Medicine | Admitting: Family Medicine

## 2020-09-03 DIAGNOSIS — S161XXA Strain of muscle, fascia and tendon at neck level, initial encounter: Secondary | ICD-10-CM

## 2020-09-03 DIAGNOSIS — S8002XA Contusion of left knee, initial encounter: Secondary | ICD-10-CM

## 2020-09-03 DIAGNOSIS — S20214A Contusion of middle front wall of thorax, initial encounter: Secondary | ICD-10-CM

## 2020-09-03 DIAGNOSIS — S20219A Contusion of unspecified front wall of thorax, initial encounter: Secondary | ICD-10-CM | POA: Diagnosis not present

## 2020-09-06 ENCOUNTER — Other Ambulatory Visit: Payer: Self-pay

## 2020-09-06 ENCOUNTER — Ambulatory Visit
Admission: RE | Admit: 2020-09-06 | Discharge: 2020-09-06 | Disposition: A | Discharge status: Home / Self Care | Payer: Self-pay | Source: Ambulatory Visit | Attending: Family Medicine | Admitting: Family Medicine

## 2020-09-06 ENCOUNTER — Other Ambulatory Visit: Payer: Self-pay | Admitting: Family Medicine

## 2020-09-06 DIAGNOSIS — R1031 Right lower quadrant pain: Secondary | ICD-10-CM

## 2020-09-06 DIAGNOSIS — M5442 Lumbago with sciatica, left side: Secondary | ICD-10-CM

## 2020-09-11 DIAGNOSIS — H16002 Unspecified corneal ulcer, left eye: Secondary | ICD-10-CM | POA: Diagnosis not present

## 2020-09-13 DIAGNOSIS — H35371 Puckering of macula, right eye: Secondary | ICD-10-CM | POA: Diagnosis not present

## 2020-09-13 DIAGNOSIS — H44002 Unspecified purulent endophthalmitis, left eye: Secondary | ICD-10-CM | POA: Diagnosis not present

## 2020-09-14 DIAGNOSIS — H44002 Unspecified purulent endophthalmitis, left eye: Secondary | ICD-10-CM | POA: Diagnosis not present

## 2020-09-16 DIAGNOSIS — H16002 Unspecified corneal ulcer, left eye: Secondary | ICD-10-CM | POA: Diagnosis not present

## 2020-09-17 ENCOUNTER — Ambulatory Visit: Payer: BC Managed Care – PPO | Admitting: Orthopaedic Surgery

## 2020-09-19 ENCOUNTER — Other Ambulatory Visit: Payer: Self-pay | Admitting: Family Medicine

## 2020-09-19 ENCOUNTER — Ambulatory Visit
Admission: RE | Admit: 2020-09-19 | Discharge: 2020-09-19 | Disposition: A | Discharge status: Home / Self Care | Payer: Self-pay | Source: Ambulatory Visit | Attending: Family Medicine | Admitting: Family Medicine

## 2020-09-19 ENCOUNTER — Other Ambulatory Visit: Payer: Self-pay

## 2020-09-19 DIAGNOSIS — R519 Headache, unspecified: Secondary | ICD-10-CM

## 2020-09-19 DIAGNOSIS — F419 Anxiety disorder, unspecified: Secondary | ICD-10-CM | POA: Diagnosis not present

## 2020-09-19 DIAGNOSIS — S0003XD Contusion of scalp, subsequent encounter: Secondary | ICD-10-CM | POA: Diagnosis not present

## 2020-09-19 DIAGNOSIS — M2548 Effusion, other site: Secondary | ICD-10-CM | POA: Diagnosis not present

## 2020-09-19 DIAGNOSIS — S0083XA Contusion of other part of head, initial encounter: Secondary | ICD-10-CM

## 2020-09-19 DIAGNOSIS — G319 Degenerative disease of nervous system, unspecified: Secondary | ICD-10-CM | POA: Diagnosis not present

## 2020-09-19 DIAGNOSIS — I6782 Cerebral ischemia: Secondary | ICD-10-CM | POA: Diagnosis not present

## 2020-09-19 DIAGNOSIS — H748X3 Other specified disorders of middle ear and mastoid, bilateral: Secondary | ICD-10-CM | POA: Diagnosis not present

## 2020-09-24 ENCOUNTER — Encounter: Payer: Self-pay | Admitting: Orthopaedic Surgery

## 2020-09-24 ENCOUNTER — Other Ambulatory Visit: Payer: Self-pay

## 2020-09-24 ENCOUNTER — Ambulatory Visit (INDEPENDENT_AMBULATORY_CARE_PROVIDER_SITE_OTHER): Payer: BC Managed Care – PPO | Admitting: Orthopaedic Surgery

## 2020-09-24 VITALS — Ht 66.0 in | Wt 118.0 lb

## 2020-09-24 DIAGNOSIS — M545 Low back pain, unspecified: Secondary | ICD-10-CM | POA: Insufficient documentation

## 2020-09-24 DIAGNOSIS — M5442 Lumbago with sciatica, left side: Secondary | ICD-10-CM | POA: Diagnosis not present

## 2020-09-24 DIAGNOSIS — M542 Cervicalgia: Secondary | ICD-10-CM

## 2020-09-24 DIAGNOSIS — M5441 Lumbago with sciatica, right side: Secondary | ICD-10-CM | POA: Diagnosis not present
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
## 2020-09-25 DIAGNOSIS — H16002 Unspecified corneal ulcer, left eye: Secondary | ICD-10-CM | POA: Diagnosis not present

## 2020-10-09 DIAGNOSIS — H35371 Puckering of macula, right eye: Secondary | ICD-10-CM | POA: Diagnosis not present

## 2020-10-09 DIAGNOSIS — H44002 Unspecified purulent endophthalmitis, left eye: Secondary | ICD-10-CM | POA: Diagnosis not present

## 2020-10-11 DIAGNOSIS — L300 Nummular dermatitis: Secondary | ICD-10-CM | POA: Diagnosis not present

## 2020-10-11 DIAGNOSIS — L011 Impetiginization of other dermatoses: Secondary | ICD-10-CM | POA: Diagnosis not present

## 2020-10-21 DIAGNOSIS — E7800 Pure hypercholesterolemia, unspecified: Secondary | ICD-10-CM | POA: Diagnosis not present

## 2020-10-21 DIAGNOSIS — R002 Palpitations: Secondary | ICD-10-CM | POA: Diagnosis not present

## 2020-10-21 DIAGNOSIS — F419 Anxiety disorder, unspecified: Secondary | ICD-10-CM | POA: Diagnosis not present

## 2020-10-21 DIAGNOSIS — F320 Major depressive disorder, single episode, mild: Secondary | ICD-10-CM | POA: Diagnosis not present

## 2020-10-28 DIAGNOSIS — H18232 Secondary corneal edema, left eye: Secondary | ICD-10-CM | POA: Diagnosis not present

## 2020-10-28 DIAGNOSIS — H44433 Hypotony of eye due to other ocular disorders, bilateral: Secondary | ICD-10-CM | POA: Diagnosis not present

## 2020-10-28 DIAGNOSIS — H18052 Posterior corneal pigmentations, left eye: Secondary | ICD-10-CM | POA: Diagnosis not present

## 2020-10-28 DIAGNOSIS — H5712 Ocular pain, left eye: Secondary | ICD-10-CM | POA: Diagnosis not present

## 2020-10-28 DIAGNOSIS — H44002 Unspecified purulent endophthalmitis, left eye: Secondary | ICD-10-CM | POA: Diagnosis not present

## 2020-10-28 DIAGNOSIS — H2702 Aphakia, left eye: Secondary | ICD-10-CM | POA: Diagnosis not present

## 2020-11-01 DIAGNOSIS — H16002 Unspecified corneal ulcer, left eye: Secondary | ICD-10-CM | POA: Diagnosis not present

## 2020-11-04 ENCOUNTER — Ambulatory Visit
Admission: RE | Admit: 2020-11-04 | Discharge: 2020-11-04 | Disposition: A | Discharge status: Home / Self Care | Payer: Self-pay | Source: Ambulatory Visit | Attending: Orthopaedic Surgery | Admitting: Orthopaedic Surgery

## 2020-11-04 DIAGNOSIS — M545 Low back pain, unspecified: Secondary | ICD-10-CM | POA: Diagnosis not present

## 2020-11-04 DIAGNOSIS — M48061 Spinal stenosis, lumbar region without neurogenic claudication: Secondary | ICD-10-CM | POA: Diagnosis not present

## 2020-11-04 NOTE — Progress Notes (Signed)
Please call and let her know she has several new compression fxs and I would like to refer her to Dr Estanislado Pandy at [facility] radiology if she agrees-thanks

## 2020-11-05 NOTE — Progress Notes (Signed)
called

## 2020-11-08 DIAGNOSIS — H16002 Unspecified corneal ulcer, left eye: Secondary | ICD-10-CM | POA: Diagnosis not present

## 2020-11-14 ENCOUNTER — Ambulatory Visit: Payer: BC Managed Care – PPO | Admitting: Orthopaedic Surgery

## 2020-11-20 ENCOUNTER — Encounter: Payer: Self-pay | Admitting: Orthopaedic Surgery

## 2020-11-20 ENCOUNTER — Other Ambulatory Visit: Payer: Self-pay

## 2020-11-20 ENCOUNTER — Ambulatory Visit (INDEPENDENT_AMBULATORY_CARE_PROVIDER_SITE_OTHER): Payer: BC Managed Care – PPO | Admitting: Orthopaedic Surgery

## 2020-11-20 VITALS — Ht 66.0 in | Wt 118.0 lb

## 2020-11-20 DIAGNOSIS — M539 Dorsopathy, unspecified: Secondary | ICD-10-CM | POA: Diagnosis not present

## 2020-11-20 DIAGNOSIS — M5442 Lumbago with sciatica, left side: Secondary | ICD-10-CM | POA: Diagnosis not present

## 2020-11-20 DIAGNOSIS — M5441 Lumbago with sciatica, right side: Secondary | ICD-10-CM

## 2020-11-20 DIAGNOSIS — H16002 Unspecified corneal ulcer, left eye: Secondary | ICD-10-CM | POA: Diagnosis not present
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
## 2020-11-25 DIAGNOSIS — L300 Nummular dermatitis: Secondary | ICD-10-CM | POA: Diagnosis not present

## 2020-11-25 DIAGNOSIS — L821 Other seborrheic keratosis: Secondary | ICD-10-CM | POA: Diagnosis not present

## 2020-11-25 DIAGNOSIS — L308 Other specified dermatitis: Secondary | ICD-10-CM | POA: Diagnosis not present

## 2020-12-04 DIAGNOSIS — E7800 Pure hypercholesterolemia, unspecified: Secondary | ICD-10-CM | POA: Diagnosis not present

## 2020-12-04 DIAGNOSIS — F320 Major depressive disorder, single episode, mild: Secondary | ICD-10-CM | POA: Diagnosis not present

## 2020-12-04 DIAGNOSIS — K589 Irritable bowel syndrome without diarrhea: Secondary | ICD-10-CM | POA: Diagnosis not present

## 2020-12-04 DIAGNOSIS — F419 Anxiety disorder, unspecified: Secondary | ICD-10-CM | POA: Diagnosis not present

## 2020-12-04 DIAGNOSIS — R002 Palpitations: Secondary | ICD-10-CM | POA: Diagnosis not present

## 2020-12-19 IMAGING — CR DG LUMBAR SPINE 2-3V
3 series · 3 of 3 positions shown · non-contrast
Comparison: 01/05/2014

CLINICAL DATA: Back pain, left sciatica

EXAM:
LUMBAR SPINE - 2-3 VIEW

[w lumbar spine ap]
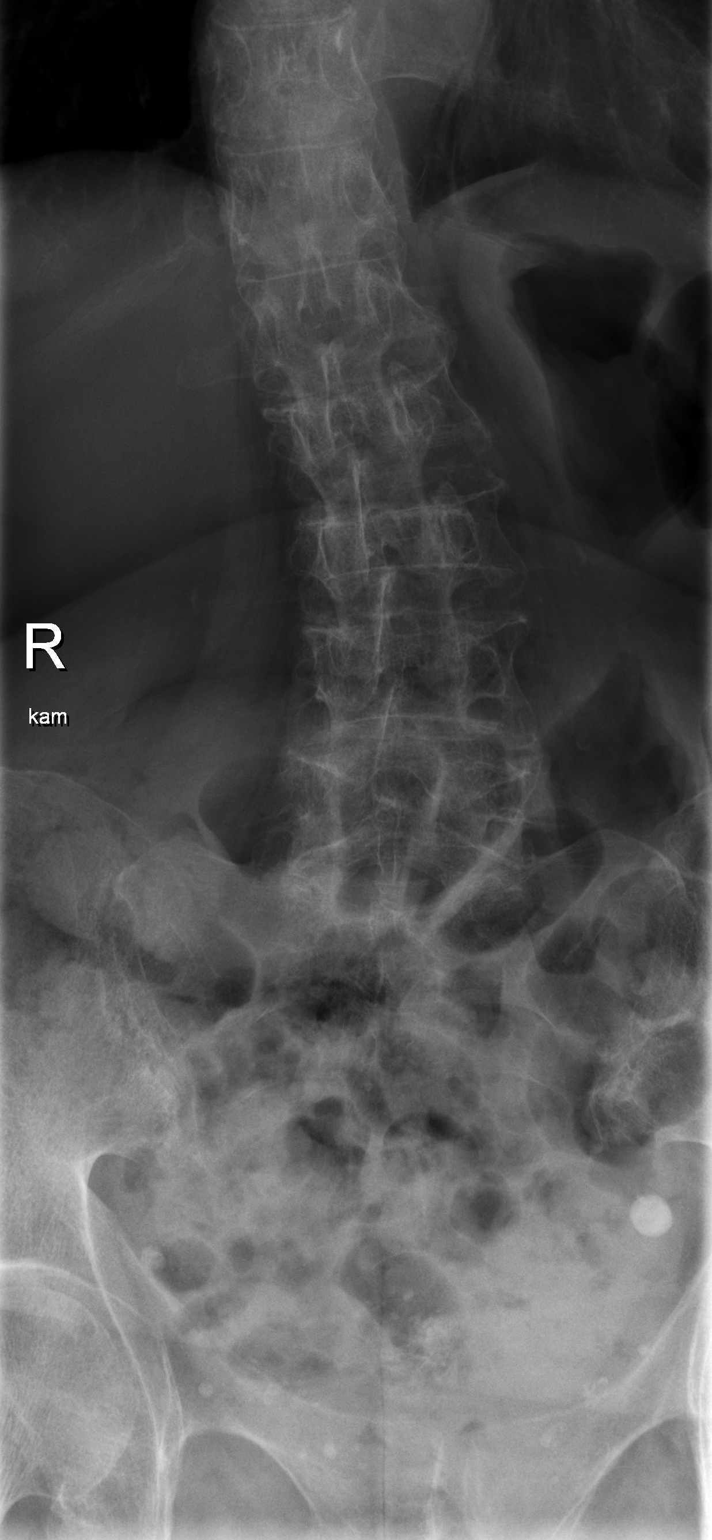

[w lumbar spine lat]
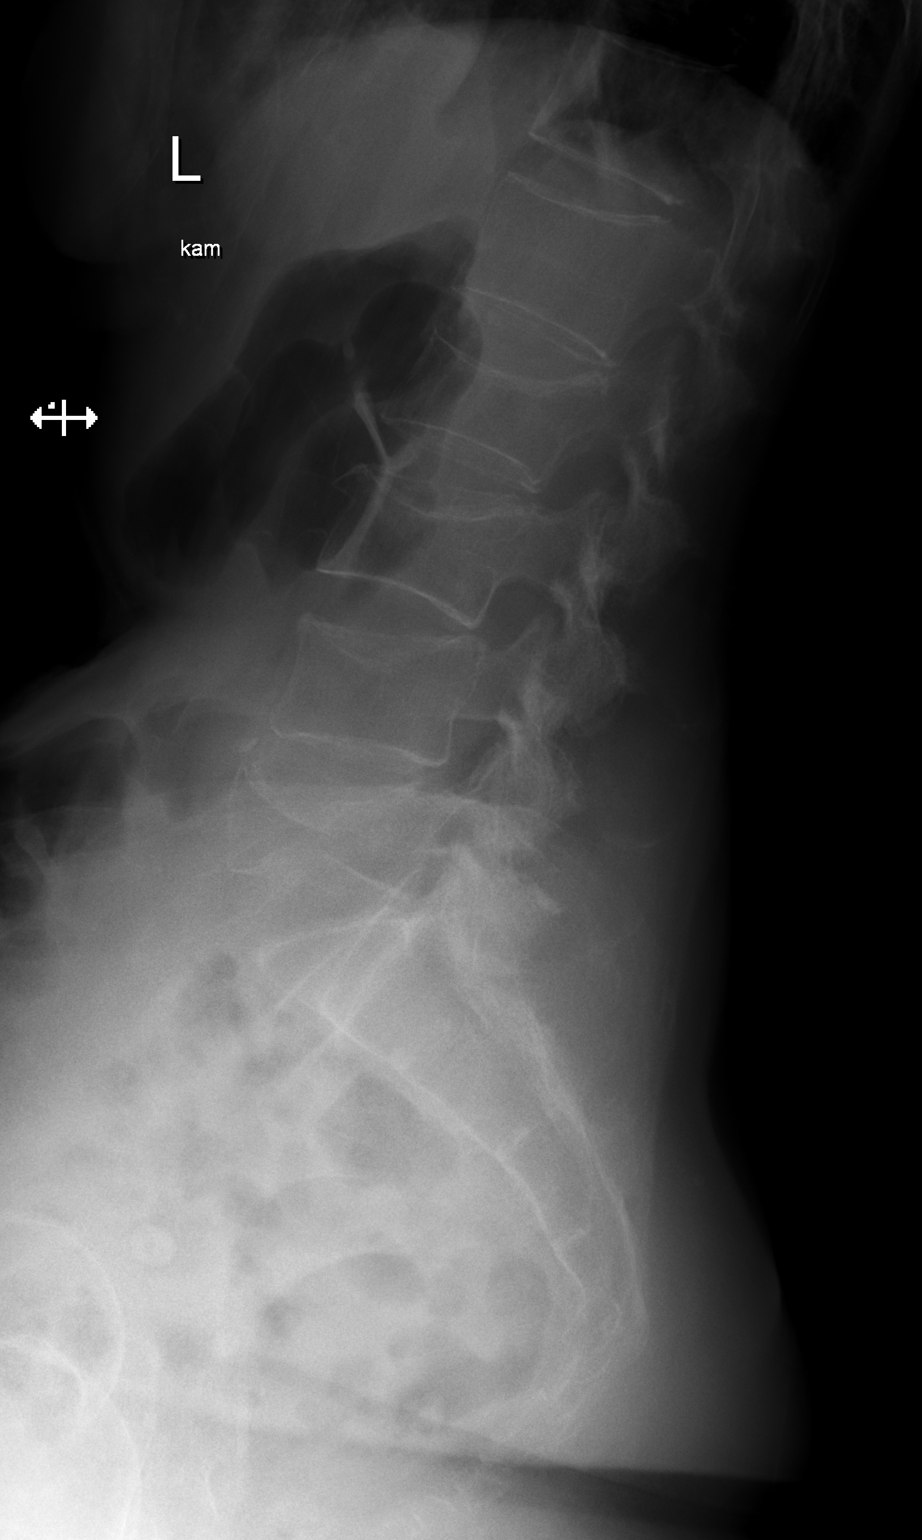

[w lumbar l-5 s-1 spot]
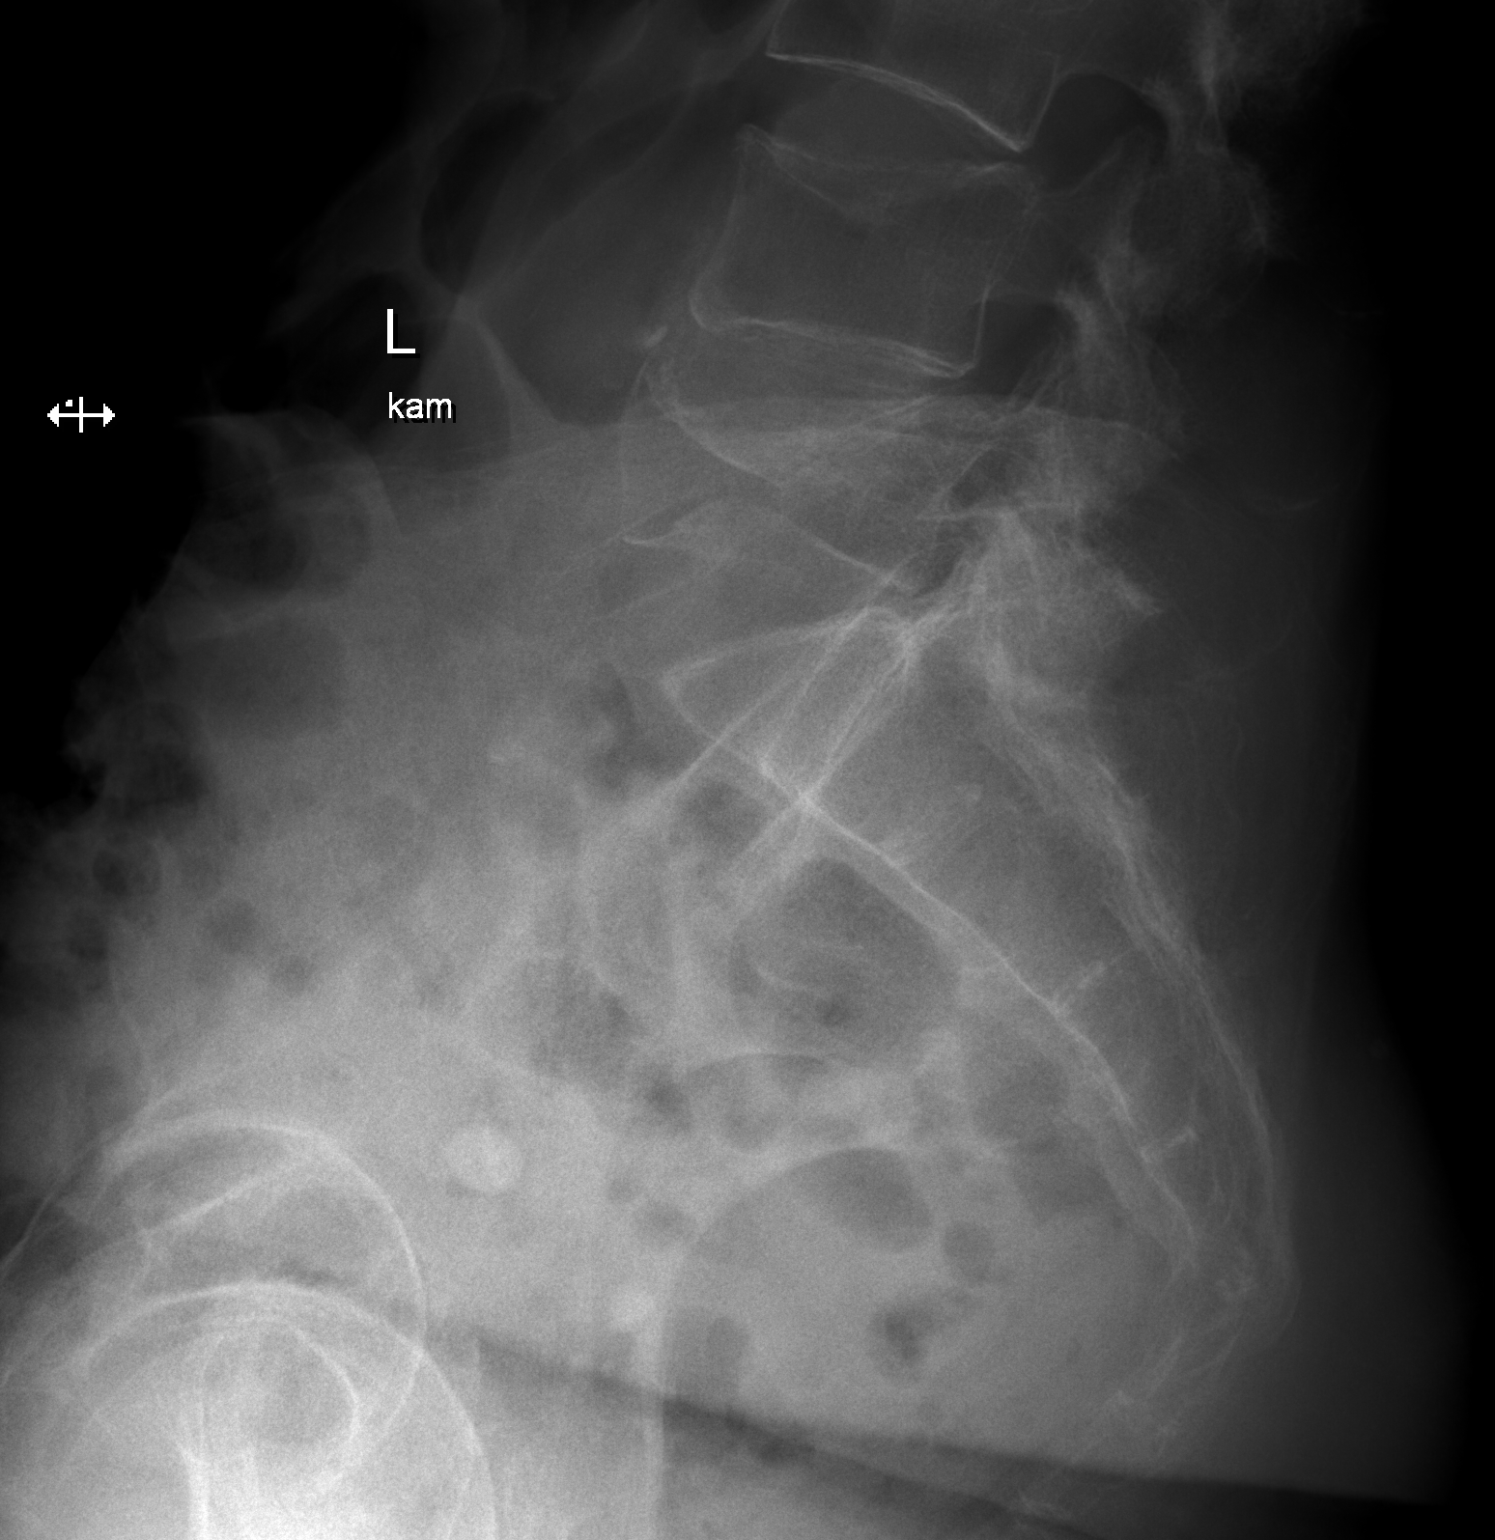

[3 of 3 positions shown; findings below may reference images not displayed]

FINDINGS: Three view radiograph lumbar spine. There are 5 non rib bearing
segments of the lumbar spine.

Since the prior examination there has developed mild levoscoliosis,
apex left at L4. There is normal lumbar lordosis. No listhesis.

There has developed superior endplate fractures of L1, L2, L3 and
L4. There is minimal loss of height of the L 1 vertebral body. L 2,
3, and 4 all demonstrate roughly 20% loss of height. These are age
indeterminate, however, lack of significant surrounding paraspinal
soft tissue swelling and rounded contour of the fracture margins
suggests that these are chronic in nature. Chronic L5 compression
fracture is again noted. Minimal retropulsion of L2. No significant
retropulsion involving the remaining lumbar fractures.

Mild intervertebral disc space narrowing and endplate remodeling at
L4-5 in keeping with at least mild degenerative disc disease at this
level. Remaining intervertebral disc heights appear preserved.
IMPRESSION: Interval development of numerous superior endplate fractures
throughout the lumbar spine all likely chronic in nature. Mild loss
of height at L2, L3, and L4 with associated development of mild
levoscoliosis. Minimal retropulsion at L2.

## 2021-01-01 DIAGNOSIS — H16002 Unspecified corneal ulcer, left eye: Secondary | ICD-10-CM | POA: Diagnosis not present

## 2021-01-03 DIAGNOSIS — H16002 Unspecified corneal ulcer, left eye: Secondary | ICD-10-CM | POA: Diagnosis not present

## 2021-01-15 DIAGNOSIS — H16002 Unspecified corneal ulcer, left eye: Secondary | ICD-10-CM | POA: Diagnosis not present

## 2021-01-27 DIAGNOSIS — H16002 Unspecified corneal ulcer, left eye: Secondary | ICD-10-CM | POA: Diagnosis not present

## 2021-02-05 DIAGNOSIS — H16002 Unspecified corneal ulcer, left eye: Secondary | ICD-10-CM | POA: Diagnosis not present

## 2021-02-12 DIAGNOSIS — H16002 Unspecified corneal ulcer, left eye: Secondary | ICD-10-CM | POA: Diagnosis not present
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
## 2021-02-26 DIAGNOSIS — H16002 Unspecified corneal ulcer, left eye: Secondary | ICD-10-CM | POA: Diagnosis not present

## 2021-03-24 DIAGNOSIS — H16002 Unspecified corneal ulcer, left eye: Secondary | ICD-10-CM | POA: Diagnosis not present

## 2021-04-21 DIAGNOSIS — L011 Impetiginization of other dermatoses: Secondary | ICD-10-CM | POA: Diagnosis not present

## 2021-04-21 DIAGNOSIS — L2089 Other atopic dermatitis: Secondary | ICD-10-CM | POA: Diagnosis not present

## 2021-05-28 DIAGNOSIS — H16002 Unspecified corneal ulcer, left eye: Secondary | ICD-10-CM | POA: Diagnosis not present

## 2021-07-03 DIAGNOSIS — L011 Impetiginization of other dermatoses: Secondary | ICD-10-CM | POA: Diagnosis not present

## 2021-07-03 DIAGNOSIS — I8312 Varicose veins of left lower extremity with inflammation: Secondary | ICD-10-CM | POA: Diagnosis not present

## 2021-07-03 DIAGNOSIS — I872 Venous insufficiency (chronic) (peripheral): Secondary | ICD-10-CM | POA: Diagnosis not present

## 2021-07-03 DIAGNOSIS — L0889 Other specified local infections of the skin and subcutaneous tissue: Secondary | ICD-10-CM | POA: Diagnosis not present

## 2021-07-03 DIAGNOSIS — I8311 Varicose veins of right lower extremity with inflammation: Secondary | ICD-10-CM | POA: Diagnosis not present

## 2021-07-03 DIAGNOSIS — D2372 Other benign neoplasm of skin of left lower limb, including hip: Secondary | ICD-10-CM | POA: Diagnosis not present

## 2021-07-15 ENCOUNTER — Telehealth: Payer: Self-pay | Admitting: Orthopaedic Surgery

## 2021-07-15 NOTE — Telephone Encounter (Signed)
11/20/20 ov note faxed to [facility] per pts request. Auth still valid fax [phone].

## 2021-07-16 ENCOUNTER — Encounter (INDEPENDENT_AMBULATORY_CARE_PROVIDER_SITE_OTHER): Payer: Self-pay | Admitting: Ophthalmology

## 2021-07-22 DIAGNOSIS — M25572 Pain in left ankle and joints of left foot: Secondary | ICD-10-CM | POA: Diagnosis not present

## 2021-07-22 DIAGNOSIS — M549 Dorsalgia, unspecified: Secondary | ICD-10-CM | POA: Diagnosis not present

## 2021-07-22 DIAGNOSIS — F419 Anxiety disorder, unspecified: Secondary | ICD-10-CM | POA: Diagnosis not present

## 2021-08-07 ENCOUNTER — Encounter (INDEPENDENT_AMBULATORY_CARE_PROVIDER_SITE_OTHER): Payer: Self-pay | Admitting: Ophthalmology

## 2021-08-16 DIAGNOSIS — N3001 Acute cystitis with hematuria: Secondary | ICD-10-CM | POA: Diagnosis not present

## 2021-08-20 DIAGNOSIS — N3001 Acute cystitis with hematuria: Secondary | ICD-10-CM | POA: Diagnosis not present

## 2021-08-20 DIAGNOSIS — R197 Diarrhea, unspecified: Secondary | ICD-10-CM | POA: Diagnosis not present

## 2021-08-23 DIAGNOSIS — R102 Pelvic and perineal pain: Secondary | ICD-10-CM | POA: Diagnosis not present

## 2021-08-27 DIAGNOSIS — R319 Hematuria, unspecified: Secondary | ICD-10-CM | POA: Diagnosis not present

## 2021-08-27 DIAGNOSIS — F419 Anxiety disorder, unspecified: Secondary | ICD-10-CM | POA: Diagnosis not present

## 2021-08-27 DIAGNOSIS — R1310 Dysphagia, unspecified: Secondary | ICD-10-CM | POA: Diagnosis not present

## 2021-08-27 DIAGNOSIS — M549 Dorsalgia, unspecified: Secondary | ICD-10-CM | POA: Diagnosis not present

## 2021-09-02 ENCOUNTER — Ambulatory Visit (INDEPENDENT_AMBULATORY_CARE_PROVIDER_SITE_OTHER): Payer: BC Managed Care – PPO | Admitting: Orthopaedic Surgery

## 2021-09-02 ENCOUNTER — Ambulatory Visit: Payer: Self-pay

## 2021-09-02 ENCOUNTER — Other Ambulatory Visit: Payer: Self-pay

## 2021-09-02 ENCOUNTER — Encounter: Payer: Self-pay | Admitting: Orthopaedic Surgery

## 2021-09-02 DIAGNOSIS — M539 Dorsopathy, unspecified: Secondary | ICD-10-CM | POA: Diagnosis not present

## 2021-09-02 DIAGNOSIS — M545 Low back pain, unspecified: Secondary | ICD-10-CM

## 2021-09-02 DIAGNOSIS — G8929 Other chronic pain: Secondary | ICD-10-CM | POA: Diagnosis not present

## 2021-09-02 DIAGNOSIS — M542 Cervicalgia: Secondary | ICD-10-CM
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
## 2021-09-03 NOTE — Addendum Note (Signed)
Addended by: Lendon Collar on: 09/03/2021 10:16 AM   Modules accepted: Orders

## 2021-09-16 DIAGNOSIS — M546 Pain in thoracic spine: Secondary | ICD-10-CM | POA: Diagnosis not present

## 2021-09-19 ENCOUNTER — Telehealth: Payer: Self-pay | Admitting: Orthopaedic Surgery

## 2021-09-19 NOTE — Telephone Encounter (Signed)
Pt daughter called and would like to have [facility] to call her.  Trena Platt   CB [phone]

## 2021-09-20 ENCOUNTER — Observation Stay (HOSPITAL_COMMUNITY)
Admission: EM | Admit: 2021-09-20 | Discharge: 2021-09-30 | Disposition: A | Discharge status: Skilled Nursing Facility | Payer: BC Managed Care – PPO | Attending: Student in an Organized Health Care Education/Training Program | Admitting: Student in an Organized Health Care Education/Training Program

## 2021-09-20 DIAGNOSIS — E43 Unspecified severe protein-calorie malnutrition: Secondary | ICD-10-CM | POA: Insufficient documentation

## 2021-09-20 DIAGNOSIS — Z79899 Other long term (current) drug therapy: Secondary | ICD-10-CM | POA: Insufficient documentation

## 2021-09-20 DIAGNOSIS — M549 Dorsalgia, unspecified: Secondary | ICD-10-CM | POA: Diagnosis not present

## 2021-09-20 DIAGNOSIS — I10 Essential (primary) hypertension: Secondary | ICD-10-CM | POA: Insufficient documentation

## 2021-09-20 DIAGNOSIS — M545 Low back pain, unspecified: Secondary | ICD-10-CM

## 2021-09-20 DIAGNOSIS — F419 Anxiety disorder, unspecified: Secondary | ICD-10-CM | POA: Diagnosis present

## 2021-09-20 DIAGNOSIS — S22088A Other fracture of T11-T12 vertebra, initial encounter for closed fracture: Secondary | ICD-10-CM | POA: Diagnosis not present

## 2021-09-20 DIAGNOSIS — M79652 Pain in left thigh: Secondary | ICD-10-CM | POA: Diagnosis not present

## 2021-09-20 DIAGNOSIS — G8929 Other chronic pain: Secondary | ICD-10-CM

## 2021-09-20 DIAGNOSIS — R102 Pelvic and perineal pain: Secondary | ICD-10-CM | POA: Diagnosis not present

## 2021-09-20 DIAGNOSIS — X58XXXA Exposure to other specified factors, initial encounter: Secondary | ICD-10-CM | POA: Insufficient documentation

## 2021-09-20 DIAGNOSIS — R2689 Other abnormalities of gait and mobility: Secondary | ICD-10-CM | POA: Insufficient documentation

## 2021-09-20 DIAGNOSIS — M5459 Other low back pain: Secondary | ICD-10-CM | POA: Diagnosis not present

## 2021-09-20 DIAGNOSIS — Z20822 Contact with and (suspected) exposure to covid-19: Secondary | ICD-10-CM | POA: Diagnosis not present

## 2021-09-20 DIAGNOSIS — M62830 Muscle spasm of back: Secondary | ICD-10-CM

## 2021-09-20 DIAGNOSIS — R52 Pain, unspecified: Secondary | ICD-10-CM

## 2021-09-20 DIAGNOSIS — S3992XA Unspecified injury of lower back, initial encounter: Secondary | ICD-10-CM | POA: Diagnosis not present

## 2021-09-20 DIAGNOSIS — H5440 Blindness, one eye, unspecified eye: Secondary | ICD-10-CM | POA: Diagnosis present

## 2021-09-21 ENCOUNTER — Emergency Department (HOSPITAL_COMMUNITY): Payer: BC Managed Care – PPO

## 2021-09-21 ENCOUNTER — Other Ambulatory Visit: Payer: Self-pay

## 2021-09-21 ENCOUNTER — Encounter (HOSPITAL_COMMUNITY): Payer: Self-pay

## 2021-09-21 DIAGNOSIS — M545 Low back pain, unspecified: Secondary | ICD-10-CM | POA: Diagnosis not present

## 2021-09-21 DIAGNOSIS — H44002 Unspecified purulent endophthalmitis, left eye: Secondary | ICD-10-CM | POA: Diagnosis not present

## 2021-09-21 DIAGNOSIS — S22088A Other fracture of T11-T12 vertebra, initial encounter for closed fracture: Secondary | ICD-10-CM | POA: Diagnosis not present

## 2021-09-21 DIAGNOSIS — R102 Pelvic and perineal pain: Secondary | ICD-10-CM | POA: Diagnosis not present

## 2021-09-21 DIAGNOSIS — M549 Dorsalgia, unspecified: Secondary | ICD-10-CM | POA: Diagnosis not present

## 2021-09-21 DIAGNOSIS — M542 Cervicalgia: Secondary | ICD-10-CM | POA: Diagnosis not present

## 2021-09-21 DIAGNOSIS — M79652 Pain in left thigh: Secondary | ICD-10-CM | POA: Diagnosis not present

## 2021-09-21 DIAGNOSIS — H5440 Blindness, one eye, unspecified eye: Secondary | ICD-10-CM | POA: Diagnosis present

## 2021-09-21 LAB — CBC WITH DIFFERENTIAL/PLATELET
Abs Immature Granulocytes: 0.01 10*3/uL (ref 0.00–0.07)
Basophils Absolute: 0 10*3/uL (ref 0.0–0.1)
Basophils Relative: 1 %
Eosinophils Absolute: 0 10*3/uL (ref 0.0–0.5)
Eosinophils Relative: 0 %
HCT: 44.6 % (ref 36.0–46.0)
Hemoglobin: 14.8 g/dL (ref 12.0–15.0)
Immature Granulocytes: 0 %
Lymphocytes Relative: 15 %
Lymphs Abs: 0.7 10*3/uL (ref 0.7–4.0)
MCH: 26.5 pg (ref 26.0–34.0)
MCHC: 33.2 g/dL (ref 30.0–36.0)
MCV: 79.9 fL — ABNORMAL LOW (ref 80.0–100.0)
Monocytes Absolute: 0.2 10*3/uL (ref 0.1–1.0)
Monocytes Relative: 4 %
Neutro Abs: 3.7 10*3/uL (ref 1.7–7.7)
Neutrophils Relative %: 80 %
Platelets: 299 10*3/uL (ref 150–400)
RBC: 5.58 MIL/uL — ABNORMAL HIGH (ref 3.87–5.11)
RDW: 14.6 % (ref 11.5–15.5)
WBC: 4.6 10*3/uL (ref 4.0–10.5)
nRBC: 0 % (ref 0.0–0.2)

## 2021-09-21 LAB — BASIC METABOLIC PANEL
Anion gap: 16 — ABNORMAL HIGH (ref 5–15)
BUN: 8 mg/dL (ref 8–23)
CO2: 18 mmol/L — ABNORMAL LOW (ref 22–32)
Calcium: 9.5 mg/dL (ref 8.9–10.3)
Chloride: 95 mmol/L — ABNORMAL LOW (ref 98–111)
Creatinine, Ser: 0.79 mg/dL (ref 0.44–1.00)
GFR, Estimated: >60 mL/min (ref >60)
Glucose, Bld: 108 mg/dL — ABNORMAL HIGH (ref 70–99)
Potassium: 3.6 mmol/L (ref 3.5–5.1)
Sodium: 129 mmol/L — ABNORMAL LOW (ref 135–145)

## 2021-09-21 LAB — URINALYSIS, ROUTINE W REFLEX MICROSCOPIC
Bacteria, UA: NONE SEEN
Bilirubin Urine: NEGATIVE
Glucose, UA: NEGATIVE mg/dL
Ketones, ur: 80 mg/dL — AB
Leukocytes,Ua: NEGATIVE
Nitrite: NEGATIVE
Protein, ur: NEGATIVE mg/dL
Specific Gravity, Urine: 1.016 (ref 1.005–1.030)
pH: 5 (ref 5.0–8.0)

## 2021-09-21 LAB — RESP PANEL BY RT-PCR (FLU A&B, COVID) ARPGX2
Influenza A by PCR: NEGATIVE
Influenza B by PCR: NEGATIVE
SARS Coronavirus 2 by RT PCR: NEGATIVE

## 2021-09-21 MED ORDER — ENOXAPARIN SODIUM 40 MG/0.4ML IJ SOSY: 40.0000 mg | PREFILLED_SYRINGE | INTRAMUSCULAR | Status: DC

## 2021-09-21 MED ORDER — ONDANSETRON HCL 4 MG/2ML IJ SOLN
INTRAMUSCULAR | Status: AC
  Administered 2021-09-21: 4 mg via INTRAVENOUS
  Filled 2021-09-21: qty 2

## 2021-09-21 MED ORDER — OXYCODONE HCL 5 MG PO TABS: 5.0000 mg | ORAL_TABLET | ORAL | Status: DC | PRN

## 2021-09-21 MED ORDER — HYDROCODONE-ACETAMINOPHEN 5-325 MG PO TABS: 1.0000 | ORAL_TABLET | ORAL | Status: DC | PRN

## 2021-09-21 MED ORDER — PROPRANOLOL HCL 10 MG PO TABS
10.0000 mg | ORAL_TABLET | Freq: Two times a day (BID) | ORAL | Status: DC
  Administered 2021-09-22 – 2021-09-30 (×8): 10 mg via ORAL
  Filled 2021-09-21 (×19): qty 1

## 2021-09-21 MED ORDER — ACETAMINOPHEN 325 MG PO TABS: 650.0000 mg | ORAL_TABLET | Freq: Four times a day (QID) | ORAL | Status: DC | PRN

## 2021-09-21 MED ORDER — MORPHINE SULFATE (PF) 2 MG/ML IV SOLN: 2.0000 mg | INTRAVENOUS | Status: DC | PRN

## 2021-09-21 MED ORDER — ACETAMINOPHEN 650 MG RE SUPP: 650.0000 mg | Freq: Four times a day (QID) | RECTAL | Status: DC | PRN

## 2021-09-21 MED ORDER — LACTATED RINGERS IV SOLN: INTRAVENOUS | Status: DC

## 2021-09-21 MED ORDER — METHOCARBAMOL 500 MG PO TABS
500.0000 mg | ORAL_TABLET | Freq: Once | ORAL | Status: AC
  Administered 2021-09-21: 500 mg via ORAL
  Filled 2021-09-21: qty 1

## 2021-09-21 MED ORDER — POLYVINYL ALCOHOL 1.4 % OP SOLN: 1.0000 [drp] | OPHTHALMIC | Status: DC | PRN

## 2021-09-21 MED ORDER — BISACODYL 5 MG PO TBEC: 5.0000 mg | DELAYED_RELEASE_TABLET | Freq: Every day | ORAL | Status: DC | PRN

## 2021-09-21 MED ORDER — LORAZEPAM 1 MG PO TABS
1.0000 mg | ORAL_TABLET | Freq: Three times a day (TID) | ORAL | Status: DC
  Administered 2021-09-22: 0.5 mg via ORAL
  Filled 2021-09-21 (×2): qty 1

## 2021-09-21 MED ORDER — ONDANSETRON HCL 4 MG PO TABS: 4.0000 mg | ORAL_TABLET | Freq: Four times a day (QID) | ORAL | Status: DC | PRN

## 2021-09-21 MED ORDER — ONDANSETRON HCL 4 MG/2ML IJ SOLN: 4.0000 mg | Freq: Four times a day (QID) | INTRAMUSCULAR | Status: DC | PRN

## 2021-09-21 MED ORDER — HYDRALAZINE HCL 20 MG/ML IJ SOLN: 5.0000 mg | INTRAMUSCULAR | Status: DC | PRN

## 2021-09-21 MED ORDER — MORPHINE SULFATE (PF) 2 MG/ML IV SOLN
2.0000 mg | Freq: Once | INTRAVENOUS | Status: AC
  Administered 2021-09-21: 2 mg via INTRAVENOUS
  Filled 2021-09-21: qty 1

## 2021-09-21 MED ORDER — PROPRANOLOL HCL 10 MG PO TABS: 10.0000 mg | ORAL_TABLET | Freq: Three times a day (TID) | ORAL | Status: DC

## 2021-09-21 MED ORDER — TRAMADOL HCL 50 MG PO TABS
50.0000 mg | ORAL_TABLET | Freq: Once | ORAL | Status: AC
  Administered 2021-09-21: 50 mg via ORAL
  Filled 2021-09-21: qty 1

## 2021-09-21 MED ORDER — PROPRANOLOL HCL 10 MG PO TABS
5.0000 mg | ORAL_TABLET | Freq: Every day | ORAL | Status: DC
  Administered 2021-09-22 – 2021-09-29 (×6): 5 mg via ORAL
  Filled 2021-09-21 (×10): qty 0.5

## 2021-09-21 MED ORDER — KETOROLAC TROMETHAMINE 15 MG/ML IJ SOLN
15.0000 mg | Freq: Once | INTRAMUSCULAR | Status: AC
  Administered 2021-09-21: 15 mg via INTRAVENOUS
  Filled 2021-09-21: qty 1

## 2021-09-21 MED ORDER — POLYETHYLENE GLYCOL 3350 17 G PO PACK: 17.0000 g | PACK | Freq: Every day | ORAL | Status: DC | PRN

## 2021-09-21 MED ORDER — CARBOXYMETHYLCELLULOSE SODIUM 0.5 % OP SOLN: 1.0000 [drp] | Freq: Every day | OPHTHALMIC | Status: DC

## 2021-09-21 MED ORDER — METHOCARBAMOL 1000 MG/10ML IJ SOLN
500.0000 mg | Freq: Four times a day (QID) | INTRAMUSCULAR | Status: DC | PRN
  Administered 2021-09-22 – 2021-09-24 (×2): 500 mg via INTRAVENOUS
  Filled 2021-09-21: qty 500
  Filled 2021-09-21 (×2): qty 5

## 2021-09-21 MED ORDER — SODIUM CHLORIDE 0.9 % IV BOLUS
500.0000 mL | Freq: Once | INTRAVENOUS | Status: AC
  Administered 2021-09-21: 500 mL via INTRAVENOUS

## 2021-09-21 MED ORDER — ACETAMINOPHEN 325 MG PO TABS
650.0000 mg | ORAL_TABLET | Freq: Once | ORAL | Status: AC
  Administered 2021-09-21: 650 mg via ORAL
  Filled 2021-09-21: qty 2

## 2021-09-21 MED ORDER — FENTANYL CITRATE PF 50 MCG/ML IJ SOSY
12.5000 ug | PREFILLED_SYRINGE | Freq: Once | INTRAMUSCULAR | Status: AC
  Administered 2021-09-21: 12.5 ug via INTRAVENOUS
  Filled 2021-09-21: qty 1

## 2021-09-21 MED ORDER — DOCUSATE SODIUM 100 MG PO CAPS
100.0000 mg | ORAL_CAPSULE | Freq: Two times a day (BID) | ORAL | Status: DC
  Administered 2021-09-22 – 2021-09-27 (×2): 100 mg via ORAL
  Filled 2021-09-21 (×8): qty 1

## 2021-09-21 MED ORDER — ONDANSETRON HCL 4 MG/2ML IJ SOLN: 4.0000 mg | Freq: Once | INTRAMUSCULAR | Status: AC

## 2021-09-21 NOTE — ED Notes (Addendum)
Pt refusing all medications and refusing to eat/drink. Dr. Lorin Mercy made aware.

## 2021-09-21 NOTE — ED Notes (Signed)
PT at [facility].

## 2021-09-21 NOTE — ED Triage Notes (Signed)
PT BIB EMS from home for increasing back pain and spasms that have caused a decrease in movement. Pt took tylenol at home and refused meds from EMS because of allergies.   192/72 HR 92  99% RA

## 2021-09-21 NOTE — TOC Initial Note (Signed)
Transition of Care [facility]) - Initial/Assessment Note    Patient Details  Name: Alisha Henderson MRN: [id number] Date of Birth: 1951/02/01  Transition of Care [facility]) CM/SW Contact:    Verdell Carmine, RN Phone Number: 09/21/2021, 9:44 AM  Clinical Narrative:                  Patient here for back pain, She has been having difficulty at home with pain, She lives by herself and has outpatient PT, she has been driving. She has no DME at home. Discussed home health and she is fine with this. PT consulted for recommendations. Patient does not have a particular agency in mind, wants to go with who works best with [employer]   Barriers to Discharge: Continued Medical Work up   Patient Goals and CMS Choice        Expected Discharge Plan and Services     Discharge Planning Services: CM Consult   Living arrangements for the past 2 months: [name]                                      Prior Living Arrangements/Services Living arrangements for the past 2 months: Single Family Home Lives with:: Self Patient language and need for interpreter reviewed:: Yes        Need for Family Participation in Patient Care: Yes (Comment) Care giver support system in place?: Yes (comment)   Criminal Activity/Legal Involvement Pertinent to Current Situation/Hospitalization: No - Comment as needed  Activities of Daily Living      Permission Sought/Granted                  Emotional Assessment   Attitude/Demeanor/Rapport: Lethargic Affect (typically observed): Accepting Orientation: : Oriented to Self, Oriented to Place, Oriented to  Time, Oriented to Situation Alcohol / Substance Use: Not Applicable Psych Involvement: No (comment)  Admission diagnosis:  back pain Patient Active Problem List   Diagnosis Date Noted   Low back pain 09/24/2020   Neck pain, acute 09/24/2020   Endophthalmitis, acute, left 01/12/2020   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

## 2021-09-21 NOTE — ED Notes (Signed)
Pt refused to try/ was unable to ambulate with PT assistance at [facility].

## 2021-09-21 NOTE — ED Notes (Signed)
Patient transported to X-ray 

## 2021-09-21 NOTE — ED Notes (Signed)
Pt continues to refuse medication stating she can only take "the brand name." Pt is asking for Robaxin to help with muscle cramps. Provider notified.

## 2021-09-21 NOTE — ED Notes (Signed)
PT contacted by phone, will see.

## 2021-09-21 NOTE — ED Provider Notes (Signed)
[facility] EMERGENCY DEPARTMENT Provider Note   CSN: [id number] Arrival date & time: 09/20/21  2355     History Chief Complaint  Patient presents with   Back Pain    Alisha Henderson is a 70 y.o. female.  Patient presents to the emergency department for evaluation of back pain.  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  She is cared for by Dr. Elias Else.  Patient reports that she had repeat MRI performed last week that did not show any recurrent fractures.  Patient reports her pain has been progressively worsening but tonight she has been having severe spasms that have been preventing her from being able to get up and walk.  She has taken Tylenol at home without improvement.      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 No pertinent surgical history.   OB History   No obstetric history on file.     History reviewed. No pertinent family history.  Social History   Tobacco Use   Smoking status: Never   Smokeless tobacco: Never  Substance Use Topics   Alcohol use: No   Drug use: No    Home Medications Prior to Admission medications   Medication Sig Start Date End Date Taking? Authorizing Provider  Calcium Carbonate-Vitamin D (CALCIUM + D PO) Take 1 tablet by mouth daily.    [provider]  clarithromycin (BIAXIN) 250 MG/5ML suspension Take 10 mLs (500 mg total) by mouth 2 (two) times daily.  01/12/20   Michel Bickers, MD  Clofazimine POWD Take 100 mg by mouth daily. 01/12/20   Michel Bickers, MD  Difluprednate 0.05 % EMUL Apply to eye.    [provider]  Gentamicin Sulfate (GENTAMICIN FORTIFIED OPHTHALMIC SOLUTION) Apply to eye.    [provider]  ibuprofen (ADVIL,MOTRIN) 800 MG tablet Take 1 tablet (800 mg total) by mouth 3 (three) times daily. 08/20/15   Noemi Chapel, MD  LORazepam (ATIVAN) 1 MG tablet Take 1 mg by mouth every 8 (eight) hours. For anxiety    [provider]  Multiple Vitamin (DAILY VITAMINS PO) Take 15 mLs by mouth daily.    [provider]  propranolol (INDERAL) 10 MG tablet Take 5-10 mg by mouth 3 (three) times daily. Takes 10 mg first two doses and then 5 mg last dose.    [provider]  Saline 0.9 % AERS Place 1 spray into the nose 2 (two) times daily.     [provider]    Allergies    Sulfa antibiotics, Ciprofloxacin, Codeine, Other, and Penicillins  Review of Systems   Review of Systems  Musculoskeletal:  Positive for back pain.  All other systems reviewed and are negative.  Physical Exam Updated Vital Signs BP (!) 141/57 (BP Location: Right Arm)   Pulse 95   Temp 98.2 F (36.8 C) (Oral)   Resp 17   SpO2 98%   Physical Exam Vitals and  nursing note reviewed.  Constitutional:      General: She is not in acute distress.    Appearance: Normal appearance. She is well-developed.  HENT:     Head: Normocephalic and atraumatic.     Right Ear: Hearing normal.     Left Ear: Hearing normal.     Nose: Nose normal.  Eyes:     Conjunctiva/sclera: Conjunctivae normal.     Pupils: Pupils are equal, round, and reactive to light.  Cardiovascular:     Rate and Rhythm: Regular rhythm.     Heart sounds: S1 normal and S2 normal. No murmur heard.   No friction rub. No gallop.  Pulmonary:     Effort: Pulmonary effort is normal. No respiratory distress.     Breath sounds: Normal breath sounds.  Chest:      Chest wall: No tenderness.  Abdominal:     General: Bowel sounds are normal.     Palpations: Abdomen is soft.     Tenderness: There is no abdominal tenderness. There is no guarding or rebound. Negative signs include Murphy's sign and McBurney's sign.     Hernia: No hernia is present.  Musculoskeletal:        General: Normal range of motion.     Cervical back: Normal range of motion and neck supple.     Lumbar back: Spasms and tenderness present.  Skin:    General: Skin is warm and dry.     Findings: No rash.  Neurological:     Mental Status: She is alert and oriented to person, place, and time.     GCS: GCS eye subscore is 4. GCS verbal subscore is 5. GCS motor subscore is 6.     Cranial Nerves: No cranial nerve deficit.     Sensory: No sensory deficit.     Coordination: Coordination normal.  Psychiatric:        Speech: Speech normal.        Behavior: Behavior normal.        Thought Content: Thought content normal.    ED Results / Procedures / Treatments   Labs (all labs ordered are listed, but only abnormal results are displayed) Labs Reviewed  CBC WITH DIFFERENTIAL/PLATELET  BASIC METABOLIC PANEL    EKG None  Radiology No results found.  Procedures Procedures   Medications Ordered in ED Medications  acetaminophen (TYLENOL) tablet 650 mg (650 mg Oral Given 09/21/21 0148)  methocarbamol (ROBAXIN) tablet 500 mg (500 mg Oral Given 09/21/21 0148)    ED Course  I have reviewed the triage vital signs and the nursing notes.  Pertinent labs & imaging results that were available during my care of the patient were reviewed by me and considered in my medical decision making (see chart for details).    MDM Rules/Calculators/A&P                           Patient presents with low back pain.  Patient with history of chronic back pain.  Patient reports that she had an MRI last week.  I do not see record of that in the system.  X-rays from October 4, however, showed no  new compression fractures.  Patient has been extremely difficult to care for her.  She has refused every intervention I have recommended.  Patient initially refused IV.  She then refused IM injections.  She would only take Tylenol which had been ineffective at home.  She then agreed to  an oral muscle relaxer.  This did not help her pain.  After period of observation she was unable to sit up, move.  I have not ordered an IV and lab work assuming that she may need hospitalization or social work involvement.  Patient is once again refusing this intervention.  We will continue to attempt to get patient to allow IV access and then administer pain medication.  Will sign out to oncoming ER physician to disposition patient.  Final Clinical Impression(s) / ED Diagnoses Final diagnoses:  Muscle spasm of back  Chronic low back pain, unspecified back pain laterality, unspecified whether sciatica present    Rx / DC Orders ED Discharge Orders     None        Orpah Greek, MD 09/21/21 [phone]

## 2021-09-21 NOTE — ED Notes (Signed)
Xray called for:  pt ready for xray

## 2021-09-21 NOTE — TOC Progression Note (Addendum)
Transition of Care [facility]) - Progression Note    Patient Details  Name: Alisha Henderson MRN: [id number] Date of Birth: 1951/06/07  Transition of Care [facility]) CM/SW [place], RN Phone Number: 09/21/2021, 11:17 AM  Clinical Narrative:     Patient refused to get up for PT evaluation. Even after discussed. She  agreed with [facility]. Called [facility], who will see her for PT OT Aide and social work. Ordered Walker and 3:1 for home use. Team and provider made aware. 1400 PT did make a note with their assessment recommending SNF, however patient had refused this to CSW.     Barriers to Discharge: Continued Medical Work up  Expected Discharge Plan and Services     Discharge Planning Services: CM Consult   Living arrangements for the past 2 months: [place]                 DME Arranged: Gilford Rile, 3-N-1 DME Agency: [employer] Date DME Agency Contacted: 09/21/21 Time DME Agency Contacted: 36   HH Arranged: OT, PT, Nurse's Aide, Social Work [employer] Agency: [place] Date [place]: 09/21/21 Time [place]: 1115 Representative spoke with at [place]: [place] ([place]) Interventions    Readmission Risk Interventions No flowsheet data found.

## 2021-09-21 NOTE — ED Notes (Signed)
XR at bedside

## 2021-09-21 NOTE — Evaluation (Signed)
Physical Therapy Evaluation Patient Details Name: Alisha Henderson MRN: [id number] DOB: [birth year] Today's Date: 09/21/2021  History of Present Illness  Pt is a 70 y.o. F who presents for evaluation of back pain. X-ray lumbar spine 09/02/2021 showing diffuse compression fractures from L2-5 that do not appear to be different from films obtained in 2021. Significant PMH: chronic back pain, anxiety, IBS.  Clinical Impression  PTA, pt lives alone in a house with 5 steps to enter and is independent. Pt premedicated ~45 mins to 1 hour prior to PT arrival per MD. Pt reporting significant lower back pain with no radicular symptoms or numbness/tingling. Interestingly, she has left proximal lower extremity pain as well, reporting she "pulled a muscle," however, upon assessment, her right lower extremity is actually more guarded and weak versus the left. Pt performed gentle warm up of bilateral heel slides and PT provided manual gastroc stretch. However, with attempts to mobilize her out of the stretcher, pt adamantly refusing despite max encouragement from PT and MD. She also refused heat or ice pack. Will continue to follow acutely to assess and update d/c plan accordingly to pt abilities.      Recommendations for follow up therapy are one component of a multi-disciplinary discharge planning process, led by the attending physician.  Recommendations may be updated based on patient status, additional functional criteria and insurance authorization.  Follow Up Recommendations SNF    Equipment Recommendations  Rolling walker with 5" wheels;3in1 (PT)    Recommendations for Other Services       Precautions / Restrictions Precautions Precautions: Fall Restrictions Weight Bearing Restrictions: No      Mobility  Bed Mobility               General bed mobility comments: pt refusing    Transfers                    Ambulation/Gait                Stairs             Wheelchair Mobility    Modified Rankin (Stroke Patients Only)       Balance                                             Pertinent Vitals/Pain Pain Assessment: Faces Faces Pain Scale: Hurts worst Pain Location: lower back, L thigh Pain Descriptors / Indicators: Grimacing;Guarding;Spasm Pain Intervention(s): Limited activity within patient's tolerance;Monitored during session;Premedicated before session;Other (comment) (offered heat/ice, but pt declined)    Home Living Family/patient expects to be discharged to:: Private residence Living Arrangements: Alone   Type of Home: House Home Access: Stairs to enter   Technical brewer of Steps: 5 Home Layout: One level Home Equipment: None      Prior Function Level of Independence: Independent         Comments: Has been driving, reports MD was setting up [name]        Extremity/Trunk Assessment   Upper Extremity Assessment Upper Extremity Assessment: RUE deficits/detail;LUE deficits/detail RUE Deficits / Details: AROM WFL, grossly 4/5 LUE Deficits / Details: AROM WFL, grossly 4/5    Lower Extremity Assessment Lower Extremity Assessment: RLE deficits/detail;LLE deficits/detail RLE Deficits / Details: Grossly 2/5, AROM WFL LLE Deficits / Details: Grossly 4/5, AROM [facility] [place]  Communication   Communication: No difficulties  Cognition Arousal/Alertness: Awake/alert Behavior During Therapy: WFL for tasks assessed/performed Overall Cognitive Status: Impaired/Different from baseline Area of Impairment: Safety/judgement                         Safety/Judgement: Decreased awareness of deficits     General Comments: Pt refusing to mobilize with PT, however, reports she will be fine with HHPT once she returns home      General Comments      Exercises General Exercises - Lower Extremity Heel Slides: AAROM;Both;10 reps;Supine Other Exercises Other Exercises:  Supine: manual bilateral gastroc stretch x 1 minute each   Assessment/Plan    PT Assessment Patient needs continued PT services  PT Problem List Decreased strength;Decreased activity tolerance;Decreased mobility;Decreased balance;Pain       PT Treatment Interventions DME instruction;Gait training;Therapeutic activities;Functional mobility training;Stair training;Therapeutic exercise;Balance training;Patient/family education    PT Goals (Current goals can be found in the Care Plan section)  Acute Rehab PT Goals Patient Stated Goal: less pain/spasms PT Goal Formulation: With patient Time For Goal Achievement: 10/05/21 Potential to Achieve Goals: Good    Frequency Min 3X/week   Barriers to discharge        Co-evaluation               AM-PAC PT "6 Clicks" Mobility  Outcome Measure Help needed turning from your back to your side while in a flat bed without using bedrails?: A Lot Help needed moving from lying on your back to sitting on the side of a flat bed without using bedrails?: A Lot Help needed moving to and from a bed to a chair (including a wheelchair)?: A Lot Help needed standing up from a chair using your arms (e.g., wheelchair or bedside chair)?: A Lot Help needed to walk in hospital room?: A Lot Help needed climbing 3-5 steps with a railing? : Total 6 Click Score: 11    End of Session   Activity Tolerance: Patient limited by pain Patient left: in bed;Other (comment) (in hallway in ED) Nurse Communication: Mobility status PT Visit Diagnosis: Other abnormalities of gait and mobility (R26.89);Pain Pain - part of body:  (back)    Time: 9518-8416 PT Time Calculation (min) (ACUTE ONLY): 17 min   Charges:   PT Evaluation $PT Eval Low Complexity: [place], PT, DPT Acute Rehabilitation Services Pager [phone] Office [phone]   Alisha Henderson 09/21/2021, 11:41 AM

## 2021-09-21 NOTE — Discharge Instructions (Signed)
Call Dr. Rudene Anda office tomorrow Return if you wish any treatment

## 2021-09-21 NOTE — ED Provider Notes (Signed)
70 yo female history of chronic back pain presents today with similar pain. Physical Exam  BP (!) 141/57 (BP Location: Right Arm)   Pulse 95   Temp 98.2 F (36.8 C) (Oral)   Resp 17   SpO2 98%   Physical Exam  ED Course/Procedures    Took acetaminophen and smr, but refused other interventions.  Procedures  MDM  No acute neuro deficits. Patient not wishing any further intervention. Plan d/c to home.  Transitions of care consulted Physical therapy at bedside Patient refuses to attempt ambulation stating she is having too much pain.  She states that she has pain in her left hip and left leg.  Repeat x-rays ordered. Home health care orders in place.  Repeat doses of morphine IV ordered With history of chronic back pain now with worsening pain and pain into her left lateral leg.  X-rays are negative.  Patient has received Robaxin, tramadol, morphine, fentanyl, Toradol without relief and has been unable to be mobilized. Plan consult to medicine for pain control and mobilization.     Pattricia Boss, MD 09/21/21 1537

## 2021-09-21 NOTE — ED Notes (Signed)
EDP at BS 

## 2021-09-21 NOTE — ED Notes (Signed)
EDP at [facility]. Pt alert, NAD, calm, interactive, resps e/u, skin W&D. Pending disposition/ plan. Pt recently refusing/ declining/ delaying care/meds.
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
## 2021-09-22 ENCOUNTER — Inpatient Hospital Stay (HOSPITAL_COMMUNITY): Payer: BC Managed Care – PPO

## 2021-09-22 ENCOUNTER — Ambulatory Visit: Payer: BC Managed Care – PPO | Admitting: Rehabilitative and Restorative Service Providers"

## 2021-09-22 ENCOUNTER — Other Ambulatory Visit: Payer: Self-pay | Admitting: Orthopaedic Surgery

## 2021-09-22 DIAGNOSIS — S32020A Wedge compression fracture of second lumbar vertebra, initial encounter for closed fracture: Secondary | ICD-10-CM | POA: Diagnosis not present

## 2021-09-22 DIAGNOSIS — S32050A Wedge compression fracture of fifth lumbar vertebra, initial encounter for closed fracture: Secondary | ICD-10-CM | POA: Diagnosis not present

## 2021-09-22 DIAGNOSIS — M4804 Spinal stenosis, thoracic region: Secondary | ICD-10-CM | POA: Diagnosis not present

## 2021-09-22 DIAGNOSIS — M545 Low back pain, unspecified: Secondary | ICD-10-CM

## 2021-09-22 DIAGNOSIS — I10 Essential (primary) hypertension: Secondary | ICD-10-CM | POA: Diagnosis not present

## 2021-09-22 DIAGNOSIS — Z79899 Other long term (current) drug therapy: Secondary | ICD-10-CM | POA: Diagnosis not present

## 2021-09-22 DIAGNOSIS — M5127 Other intervertebral disc displacement, lumbosacral region: Secondary | ICD-10-CM | POA: Diagnosis not present

## 2021-09-22 DIAGNOSIS — M549 Dorsalgia, unspecified: Secondary | ICD-10-CM | POA: Diagnosis not present

## 2021-09-22 DIAGNOSIS — S3992XA Unspecified injury of lower back, initial encounter: Secondary | ICD-10-CM | POA: Diagnosis not present

## 2021-09-22 DIAGNOSIS — E43 Unspecified severe protein-calorie malnutrition: Secondary | ICD-10-CM | POA: Insufficient documentation

## 2021-09-22 DIAGNOSIS — S22088A Other fracture of T11-T12 vertebra, initial encounter for closed fracture: Secondary | ICD-10-CM | POA: Diagnosis not present

## 2021-09-22 DIAGNOSIS — M62830 Muscle spasm of back: Secondary | ICD-10-CM | POA: Diagnosis not present

## 2021-09-22 DIAGNOSIS — G8929 Other chronic pain: Secondary | ICD-10-CM

## 2021-09-22 DIAGNOSIS — M47816 Spondylosis without myelopathy or radiculopathy, lumbar region: Secondary | ICD-10-CM | POA: Diagnosis not present

## 2021-09-22 DIAGNOSIS — Z20822 Contact with and (suspected) exposure to covid-19: Secondary | ICD-10-CM | POA: Diagnosis not present

## 2021-09-22 DIAGNOSIS — X58XXXA Exposure to other specified factors, initial encounter: Secondary | ICD-10-CM | POA: Diagnosis not present

## 2021-09-22 LAB — BASIC METABOLIC PANEL
Anion gap: 13 (ref 5–15)
BUN: 6 mg/dL — ABNORMAL LOW (ref 8–23)
CO2: 20 mmol/L — ABNORMAL LOW (ref 22–32)
Calcium: 8.7 mg/dL — ABNORMAL LOW (ref 8.9–10.3)
Chloride: 98 mmol/L (ref 98–111)
Creatinine, Ser: 0.69 mg/dL (ref 0.44–1.00)
GFR, Estimated: >60 mL/min (ref >60)
Glucose, Bld: 95 mg/dL (ref 70–99)
Potassium: 3.5 mmol/L (ref 3.5–5.1)
Sodium: 131 mmol/L — ABNORMAL LOW (ref 135–145)

## 2021-09-22 LAB — CBC
HCT: 42.2 % (ref 36.0–46.0)
Hemoglobin: 14.1 g/dL (ref 12.0–15.0)
MCH: 26.3 pg (ref 26.0–34.0)
MCHC: 33.4 g/dL (ref 30.0–36.0)
MCV: 78.6 fL — ABNORMAL LOW (ref 80.0–100.0)
Platelets: 274 10*3/uL (ref 150–400)
RBC: 5.37 MIL/uL — ABNORMAL HIGH (ref 3.87–5.11)
RDW: 14.6 % (ref 11.5–15.5)
WBC: 5.6 10*3/uL (ref 4.0–10.5)
nRBC: 0 % (ref 0.0–0.2)

## 2021-09-22 MED ORDER — HYDRALAZINE HCL 25 MG PO TABS
25.0000 mg | ORAL_TABLET | Freq: Three times a day (TID) | ORAL | Status: DC
  Administered 2021-09-22: 25 mg via ORAL
  Filled 2021-09-22 (×2): qty 1

## 2021-09-22 MED ORDER — LORAZEPAM 2 MG/ML IJ SOLN
0.5000 mg | Freq: Four times a day (QID) | INTRAMUSCULAR | Status: DC
  Administered 2021-09-22 – 2021-09-23 (×2): 0.5 mg via INTRAVENOUS
  Filled 2021-09-22 (×3): qty 1

## 2021-09-22 MED ORDER — TRAMADOL HCL 50 MG PO TABS
50.0000 mg | ORAL_TABLET | Freq: Four times a day (QID) | ORAL | Status: DC | PRN
  Administered 2021-09-22: 50 mg via ORAL
  Filled 2021-09-22: qty 1

## 2021-09-22 MED ORDER — ADULT MULTIVITAMIN W/MINERALS CH
1.0000 | ORAL_TABLET | Freq: Every day | ORAL | Status: DC
  Administered 2021-09-23 – 2021-09-30 (×7): 1 via ORAL
  Filled 2021-09-22 (×7): qty 1

## 2021-09-22 NOTE — Progress Notes (Signed)
PROGRESS NOTE    Alisha Henderson  QJJ:[id number] DOB: 16-Mar-1951 DOA: 09/20/2021 PCP: Shirline Frees, MD   Chief Complaint  Patient presents with   Back Pain  Brief Narrative/Hospital Course: Alisha Henderson, 70 y.o. female with PMH of  low back pain with compression fractures presenting with worsening pain after an episode at home where CT "heart her back".  Patient reports she pulled a muscle while sitting herself in the bed and since then she cannot walk or stand so came to the ED.  She has been dealing with low back pain for a while, she was seen by ortho on 10/4 with L2-L5 compression fractures and thoracic kyphosis; refused kyphoplasty , she was sent to PT and pain management. She was seen in the ED x-ray pelvis and femur no acute finding and admitted for further work-up.   Subjective: Complains of spasm on the back mostly with movement.  No bowel bladder incontinence sensation intact and able to move her lower extremities but it triggers a spasm pain in the back  Assessment & Plan:  Intractable low back pain Known compression fractures lower thoracic and lumbar vertebrae: Orthopedic has been consulted, possible repeat of MRI if patient agrees.  Discussed with Dr. Durward Fortes who will d/w her for MRI and may ne kypho.Last MRI from 11/04/2020 showed diffuse lower thoracic and lumbar compression fractures, new at L3 level, L4 compression fractures subacute  and chronic L5 lumbar compression fracture.  She is requesting tramadol for pain used oxycodone and other pain meds.  Continue muscle relaxant, PT OT once cleared by orthopedics.  Elevated blood pressure: Patient propanolol appears for anxiety, likely from pain add oral hydralazine continue as needed IV medication urgent  Anxiety disorder on Ativan and propanolol.  Mood is stable.  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  Mild metabolic acidosis repeat labs  Low BMI in the context of chronic illness already consulted  DVT prophylaxis: enoxaparin (LOVENOX) injection 40 mg Start: 09/21/21 1644 Code Status:   Code Status: Full Code Family Communication: plan of care discussed with patient at bedside. Status is: Inpatient  Remains inpatient appropriate because: Ongoing management of low back pain further orthopedic input  Patient is from home lives alone.    Objective: Vitals last 24 hrs: Vitals:   09/21/21 1835 09/21/21 2330 09/22/21 0741 09/22/21 0941  BP: (!) 147/56 (!) 158/55 (!) 159/54 (!) 188/66  Pulse: 98 82 86 85  Resp: 20 16 16 19   Temp:   97.8 F (36.6 C) 97.9 F (36.6 C)  TempSrc:   Oral Oral  SpO2: 98% 98% 99% 100%   Weight change:   Intake/Output Summary (Last 24 hours) at 09/22/2021 1018 Last data filed at 09/21/2021 1440 Gross per 24 hour  Intake 500 ml  Output --  Net 500 ml   Net IO Since Admission: 500 mL [09/22/21 1018]   Physical Examination: General exam: AA0x3, weak,older than stated age. HEENT:Oral mucosa moist, Ear/Nose WNL grossly,dentition normal. Respiratory system: B/l clear BS, no use of accessory muscle, non tender. Cardiovascular system: S1 & S2 +,No JVD. Gastrointestinal system: Abdomen soft, NT,ND, BS+. Nervous System:Alert, awake, moving extremities but limited due to muscle spasm on the back. Extremities: edema none, distal peripheral pulses palpable.  Skin: No rashes, no icterus. MSK: Normal muscle bulk, tone, power.  Medications reviewed:  Scheduled Meds:  docusate sodium  100 mg Oral  BID   enoxaparin (LOVENOX) injection  40 mg Subcutaneous Q24H   LORazepam  1 mg Oral Q8H   propranolol  10 mg Oral BID   propranolol  5 mg Oral QHS   Continuous Infusions:  lactated ringers 75 mL/hr at 09/21/21 1443   methocarbamol (ROBAXIN) IV Stopped (09/22/21 0152)   Diet Order             Diet  regular Room service appropriate? Yes; Fluid consistency: Thin  Diet effective now                          Weight change:   Wt Readings from Last 3 Encounters:  11/20/20 53.5 kg  09/24/20 53.5 kg  02/06/20 56.1 kg     Consultants:see note  Procedures:see note Antimicrobials: Anti-infectives (From admission, onward)    None      Culture/Microbiology No results found for: SDES, SPECREQUEST, CULT, REPTSTATUS  Other culture-see note  Unresulted Labs (From admission, onward)     Start     Ordered   09/23/21 5573  Basic metabolic panel  Daily,   R     Question:  Specimen collection method  Answer:  Lab=Lab collect   09/22/21 0805   09/22/21 2202  Basic metabolic panel  Tomorrow morning,   R        09/21/21 1543   09/22/21 0500  CBC  Tomorrow morning,   R        09/21/21 1543           Data Reviewed: I have personally reviewed following labs and imaging studies CBC: Recent Labs  Lab 09/21/21 0620  WBC 4.6  NEUTROABS 3.7  HGB 14.8  HCT 44.6  MCV 79.9*  PLT 542   Basic Metabolic Panel: Recent Labs  Lab 09/21/21 0620  NA 129*  K 3.6  CL 95*  CO2 18*  GLUCOSE 108*  BUN 8  CREATININE 0.79  CALCIUM 9.5   GFR: CrCl cannot be calculated (Unknown ideal weight.). Liver Function Tests: No results for input(s): AST, ALT, ALKPHOS, BILITOT, PROT, ALBUMIN in the last 168 hours. No results for input(s): LIPASE, AMYLASE in the last 168 hours. No results for input(s): AMMONIA in the last 168 hours. Coagulation Profile: No results for input(s): INR, PROTIME in the last 168 hours. Cardiac Enzymes: No results for input(s): CKTOTAL, CKMB, CKMBINDEX, TROPONINI in the last 168 hours. BNP (last 3 results) No results for input(s): PROBNP in the last 8760 hours. HbA1C: No results for input(s): HGBA1C in the last 72 hours. CBG: No results for input(s): GLUCAP in the last 168 hours. Lipid Profile: No results for input(s): CHOL, HDL, LDLCALC, TRIG, CHOLHDL,  LDLDIRECT in the last 72 hours. Thyroid Function Tests: No results for input(s): TSH, T4TOTAL, FREET4, T3FREE, THYROIDAB in the last 72 hours. Anemia Panel: No results for input(s): VITAMINB12, FOLATE, FERRITIN, TIBC, IRON, RETICCTPCT in the last 72 hours. Sepsis Labs: No results for input(s): PROCALCITON, LATICACIDVEN in the last 168 hours.  Recent Results (from the past 240 hour(s))  Resp Panel by RT-PCR (Flu A&B, Covid) Nasopharyngeal Swab     Status: None   Collection Time: 09/21/21  8:52 PM   Specimen: Nasopharyngeal Swab; Nasopharyngeal(NP) swabs in vial transport medium  Result Value Ref Range Status   SARS Coronavirus 2 by RT PCR NEGATIVE NEGATIVE Final    Comment: (NOTE) SARS-CoV-2 target nucleic acids are NOT DETECTED.  The SARS-CoV-2 RNA is generally detectable in upper respiratory  specimens during the acute phase of infection. The lowest concentration of SARS-CoV-2 viral copies this assay can detect is 138 copies/mL. A negative result does not preclude SARS-Cov-2 infection and should not be used as the sole basis for treatment or other patient management decisions. A negative result may occur with  improper specimen collection/handling, submission of specimen other than nasopharyngeal swab, presence of viral mutation(s) within the areas targeted by this assay, and inadequate number of viral copies(<138 copies/mL). A negative result must be combined with clinical observations, patient history, and epidemiological information. The expected result is Negative.  Fact Sheet for Patients:  EntrepreneurPulse.com.au  Fact Sheet for Healthcare Providers:  IncredibleEmployment.be  This test is no t yet approved or cleared by the [place] FDA and  has been authorized for detection and/or diagnosis of SARS-CoV-2 by FDA under an Emergency Use Authorization (EUA). This EUA will remain  in effect (meaning this test can be used) for the  duration of the COVID-19 declaration under Section 564(b)(1) of the Act, 21 U.S.C.section 360bbb-3(b)(1), unless the authorization is terminated  or revoked sooner.       Influenza A by PCR NEGATIVE NEGATIVE Final   Influenza B by PCR NEGATIVE NEGATIVE Final    Comment: (NOTE) The Xpert Xpress SARS-CoV-2/FLU/RSV plus assay is intended as an aid in the diagnosis of influenza from Nasopharyngeal swab specimens and should not be used as a sole basis for treatment. Nasal washings and aspirates are unacceptable for Xpert Xpress SARS-CoV-2/FLU/RSV testing.  Fact Sheet for Patients: EntrepreneurPulse.com.au  Fact Sheet for Healthcare Providers: IncredibleEmployment.be  This test is not yet approved or cleared by the [place] FDA and has been authorized for detection and/or diagnosis of SARS-CoV-2 by FDA under an Emergency Use Authorization (EUA). This EUA will remain in effect (meaning this test can be used) for the duration of the COVID-19 declaration under Section 564(b)(1) of the Act, 21 U.S.C. section 360bbb-3(b)(1), unless the authorization is terminated or revoked.  Performed at [facility], [place] [address], [place], [place] [address]      Radiology Studies: DG Pelvis 1-2 Views  Result Date: 09/21/2021 CLINICAL DATA:  Increasing back pain.  Muscle spasms. EXAM: PELVIS - 1-2 VIEW; LEFT FEMUR 2 VIEWS COMPARISON:  Pelvic radiograph 09/06/2020, left knee radiograph 09/04/2019 FINDINGS: Diffuse osteopenia. There is no evidence of pelvic fracture or diastasis. No pelvic bone lesions are seen. Chronic compression fracture of the L5 vertebral body. No fracture of the left femur. No left hip or left knee dislocation. Soft tissues are unremarkable. IMPRESSION: No acute osseous abnormality of the pelvis or left femur. Electronically Signed   By: Ileana Roup M.D.   On: 09/21/2021 12:49   DG Femur Min 2 Views Left  Result Date:  09/21/2021 CLINICAL DATA:  Increasing back pain.  Muscle spasms. EXAM: PELVIS - 1-2 VIEW; LEFT FEMUR 2 VIEWS COMPARISON:  Pelvic radiograph 09/06/2020, left knee radiograph 09/04/2019 FINDINGS: Diffuse osteopenia. There is no evidence of pelvic fracture or diastasis. No pelvic bone lesions are seen. Chronic compression fracture of the L5 vertebral body. No fracture of the left femur. No left hip or left knee dislocation. Soft tissues are unremarkable. IMPRESSION: No acute osseous abnormality of the pelvis or left femur. Electronically Signed   By: Ileana Roup M.D.   On: 09/21/2021 12:49     LOS: 1 day   Antonieta Pert, MD [facility]  09/22/2021, 10:18 AM

## 2021-09-22 NOTE — ED Notes (Signed)
This RN called [facility] charge RN & was informed of accepting RN.
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
## 2021-09-22 NOTE — ED Notes (Signed)
Breakfast Orders placed 

## 2021-09-22 NOTE — Care Management CC44 (Signed)
Condition Code 44 Documentation Completed  Patient Details  Name: Alisha Henderson MRN: [id number] Date of Birth: Apr 03, 1951   Condition Code 44 given:  Yes Patient signature on Condition Code 44 notice:  Yes Documentation of 2 MD's agreement:  Yes Code 44 added to claim:  Yes    Cyndi Bender, RN 09/22/2021, 4:04 PM

## 2021-09-22 NOTE — ED Notes (Signed)
This RN called 5N to initiate report & no RN was assigned to pt (per Network engineer).

## 2021-09-22 NOTE — Plan of Care (Signed)

## 2021-09-22 NOTE — Care Management Obs Status (Signed)
[place] NOTIFICATION   Patient Details  Name: Alisha Henderson MRN: [id number] Date of Birth: Jun 28, 1951   Medicare Observation Status Notification Given:  Yes    Cyndi Bender, RN 09/22/2021, 4:04 PM

## 2021-09-22 NOTE — ED Notes (Addendum)
Pt c/o back muscular spasms. PRN IV Robaxin ordered from main Rx. Primary RN informed

## 2021-09-22 NOTE — Progress Notes (Signed)
OT Cancellation Note  Patient Details Name: Alisha Henderson MRN: [id number] DOB: February 14, 1951   Cancelled Treatment:    Reason Eval/Treat Not Completed: Other (comment). Pt reporting to PT she is "postponing (therapy) until she can speak to Rodell Perna." Will return as schedule allows.   [place], OTR/L Acute Rehab Pager: [phone] Office: [phone] 09/22/2021, 11:45 AM

## 2021-09-22 NOTE — Progress Notes (Signed)
PT Cancellation Note  Patient Details Name: Alisha Henderson MRN: [id number] DOB: [birth year]   Cancelled Treatment:    Reason Eval/Treat Not Completed: Patient declined, no reason specified Pt adamantly refusing physical therapy session or any attempts to mobilize out of bed despite being premedicated with Tramadol 1 hour prior. Pt states she is "postponing until she can speak to Rodell Perna."   Wyona Almas, PT, DPT Acute Rehabilitation Services Pager [phone] Office [phone]    Deno Etienne 09/22/2021, 11:22 AM

## 2021-09-22 NOTE — Progress Notes (Addendum)
Patient refused current pain meds (Oxy and Morphine).. patient requested Tramadol.. MD ordered  Pills need to be crushed and given with applesauce.  1124: Patient is refusing to take her Hydralazine... patient educated... MD made aware.

## 2021-09-22 NOTE — Progress Notes (Signed)
Initial Nutrition Assessment  DOCUMENTATION CODES:  Severe malnutrition in context of chronic illness  INTERVENTION:  Continue regular diet order.  Obtain updated weight.  Add Magic cup TID with meals, each supplement provides 290 kcal and 9 grams of protein.  Add [employer] Breakfast po TID, each supplement provides 140 kcal and 5 grams of protein.  Add chocolate pudding TID to meal trays - RD to order.  Add MVI with minerals daily.  Recommend adding feeding assistance with meals.  Encourage PO intake.  NUTRITION DIAGNOSIS:  Severe Malnutrition related to chronic illness (chronic back pain) as evidenced by severe fat depletion, severe muscle depletion.  GOAL:  Patient will meet greater than or equal to 90% of their needs  MONITOR:  PO intake, Supplement acceptance, Labs, Weight trends, I & O's  REASON FOR ASSESSMENT:  Consult Assessment of nutrition requirement/status  ASSESSMENT:  70 yo female with a PMH of low back pain with compression fractures, malnutrition, and L eye blindness who presents with intractable back pain.  Spoke with pt at bedside. Pt reports that she has been losing a lot of weight, despite eating at okay at home.  She denies any appetite changes.  Patient with no updated weight. RD to order to assess for weight changes. Pt unsure how much weight she has lost.  RD to order [employer] Breakfast TID, chocolate pudding TID, and Magic Cup TID for patient by request.  Pt may benefit from feeding assistance due to constant spasms when moving.  Medications: reviewed; colace BID, Ativan TID, LR @ 75 ml/hr  Labs: reviewed; Na 129 (L), Glucose 108 (H)  NUTRITION - FOCUSED PHYSICAL EXAM: Flowsheet Row Most Recent Value  Orbital Region Severe depletion  Upper Arm Region Severe depletion  Thoracic and Lumbar Region Severe depletion  Buccal Region Severe depletion  Temple Region Severe depletion  Clavicle Bone Region Severe depletion   Clavicle and Acromion Bone Region Severe depletion  Scapular Bone Region Severe depletion  Dorsal Hand Severe depletion  Patellar Region Severe depletion  Anterior Thigh Region Severe depletion  Posterior Calf Region Severe depletion  Edema (RD Assessment) None  Hair Reviewed  Eyes Reviewed  Mouth Reviewed  Skin Reviewed  Nails Reviewed   Diet Order:   Diet Order             Diet regular Room service appropriate? Yes; Fluid consistency: Thin  Diet effective now                  EDUCATION NEEDS:  Education needs have been addressed  Skin:  Skin Assessment: Reviewed RN Assessment  Last BM:  09/21/21  Height:  Ht Readings from Last 1 Encounters:  11/20/20 5\' 6"  (1.676 m)   Weight:  Wt Readings from Last 1 Encounters:  11/20/20 53.5 kg   BMI:  There is no height or weight on file to calculate BMI.  Estimated Nutritional Needs:  Kcal:  1650-1850 Protein:  75-90 grams Fluid:  >1.65 L  Derrel Nip, RD, LDN (she/her/hers) Registered Dietitian I After-Hours/Weekend Pager # in [place]

## 2021-09-22 NOTE — Care Management Obs Status (Incomplete)
[place] NOTIFICATION   Patient Details  Name: Alisha Henderson MRN: [id number] Date of Birth: [birth year]   Medicare Observation Status Notification Given:  Yes    Cyndi Bender, RN 09/22/2021, 4:04 PM

## 2021-09-23 DIAGNOSIS — I10 Essential (primary) hypertension: Secondary | ICD-10-CM | POA: Diagnosis not present

## 2021-09-23 DIAGNOSIS — S22070A Wedge compression fracture of T9-T10 vertebra, initial encounter for closed fracture: Secondary | ICD-10-CM | POA: Diagnosis not present

## 2021-09-23 DIAGNOSIS — Z20822 Contact with and (suspected) exposure to covid-19: Secondary | ICD-10-CM | POA: Diagnosis not present

## 2021-09-23 DIAGNOSIS — X58XXXA Exposure to other specified factors, initial encounter: Secondary | ICD-10-CM | POA: Diagnosis not present

## 2021-09-23 DIAGNOSIS — Z79899 Other long term (current) drug therapy: Secondary | ICD-10-CM | POA: Diagnosis not present

## 2021-09-23 DIAGNOSIS — S22080A Wedge compression fracture of T11-T12 vertebra, initial encounter for closed fracture: Secondary | ICD-10-CM | POA: Diagnosis not present

## 2021-09-23 DIAGNOSIS — M549 Dorsalgia, unspecified: Secondary | ICD-10-CM | POA: Diagnosis not present

## 2021-09-23 DIAGNOSIS — S3992XA Unspecified injury of lower back, initial encounter: Secondary | ICD-10-CM | POA: Diagnosis not present

## 2021-09-23 DIAGNOSIS — S22088A Other fracture of T11-T12 vertebra, initial encounter for closed fracture: Secondary | ICD-10-CM | POA: Diagnosis not present

## 2021-09-23 LAB — NA AND K (SODIUM & POTASSIUM), RAND UR
Potassium Urine: 43 mmol/L
Sodium, Ur: 162 mmol/L

## 2021-09-23 LAB — BASIC METABOLIC PANEL
Anion gap: 11 (ref 5–15)
BUN: 8 mg/dL (ref 8–23)
CO2: 19 mmol/L — ABNORMAL LOW (ref 22–32)
Calcium: 8.6 mg/dL — ABNORMAL LOW (ref 8.9–10.3)
Chloride: 98 mmol/L (ref 98–111)
Creatinine, Ser: 0.67 mg/dL (ref 0.44–1.00)
GFR, Estimated: >60 mL/min (ref >60)
Glucose, Bld: 86 mg/dL (ref 70–99)
Potassium: 3.1 mmol/L — ABNORMAL LOW (ref 3.5–5.1)
Sodium: 128 mmol/L — ABNORMAL LOW (ref 135–145)

## 2021-09-23 LAB — OSMOLALITY, URINE: Osmolality, Ur: 698 mOsm/kg (ref 300–900)

## 2021-09-23 MED ORDER — LORAZEPAM 1 MG PO TABS
1.0000 mg | ORAL_TABLET | Freq: Three times a day (TID) | ORAL | Status: DC
  Administered 2021-09-23: 1 mg via ORAL
  Administered 2021-09-26: 0.5 mg via ORAL
  Administered 2021-09-26 – 2021-09-27 (×3): 1 mg via ORAL
  Filled 2021-09-23 (×10): qty 1

## 2021-09-23 NOTE — Evaluation (Signed)
Clinical/Bedside Swallow Evaluation Patient Details  Name: Alisha Henderson MRN: [id number] Date of Birth: [birth year]  Today's Date: 09/23/2021 Time: SLP Start Time (ACUTE ONLY): 8 SLP Stop Time (ACUTE ONLY): 1617 SLP Time Calculation (min) (ACUTE ONLY): 14 min  Past Medical History:  Past Medical History:  Diagnosis Date   Degenerative disc disease, lumbar    Mitral valve prolapse    Past Surgical History: History reviewed. No pertinent surgical history. HPI:  70 year old female admitted with worsening lower back pain. PMH: osteoporosis,  L2, L3-L4 and L5 osteoporotic vertebral body compression fractures. Per chart pt refusing po's intermittently and needs pills in applesauce.    Assessment / Plan / Recommendation  Clinical Impression  70 cachectic patient reported her swallowing as better today. She being followed by RD and dicussed significant stress with her job. Oropharyngeal function appeared within functional limits across consistencies. No indications of aspiration or suspicion of GERD. Recommend she continue regular texture, thin liquids, pills whole in puree (as reported in chart). No further ST needed. SLP Visit Diagnosis: Dysphagia, unspecified (R13.10)    Aspiration Risk  Mild aspiration risk    Diet Recommendation Regular;Thin liquid   Liquid Administration via: Cup;Straw Medication Administration: Whole meds with puree Supervision: Patient able to self feed Postural Changes: Seated upright at 90 degrees    Other  Recommendations Oral Care Recommendations: Oral care BID    Recommendations for follow up therapy are one component of a multi-disciplinary discharge planning process, led by the attending physician.  Recommendations may be updated based on patient status, additional functional criteria and insurance authorization.  Follow up Recommendations None      Frequency and Duration            Prognosis        Swallow Study   General Date of Onset:  09/23/21 HPI: 70 year old female admitted with worsening lower back pain. PMH: osteoporosis,  L2, L3-L4 and L5 osteoporotic vertebral body compression fractures. Per chart pt refusing po's intermittently and needs pills in applesauce. Type of Study: Bedside Swallow Evaluation Previous Swallow Assessment:  (none) Diet Prior to this Study: Regular;Thin liquids Temperature Spikes Noted: No Respiratory Status: Room air History of Recent Intubation: No Behavior/Cognition: Alert;Requires cueing Oral Cavity Assessment: Within Functional Limits Oral Care Completed by SLP: No Oral Cavity - Dentition: Adequate natural dentition Vision: Functional for self-feeding Self-Feeding Abilities: Able to feed self Patient Positioning: Upright in bed Baseline Vocal Quality: Normal    Oral/Motor/Sensory Function Overall Oral Motor/Sensory Function: Within functional limits   Ice Chips Ice chips: Not tested   Thin Liquid Thin Liquid: Within functional limits Presentation: Cup;Straw    Nectar Thick Nectar Thick Liquid: Not tested   Honey Thick Honey Thick Liquid: Not tested   Puree Puree: Within functional limits   Solid     Solid: Within functional limits      Alisha Henderson 09/23/2021,5:11 PM  Alisha Henderson [place].Ed Risk analyst [phone] Office [phone]

## 2021-09-23 NOTE — Telephone Encounter (Signed)
called

## 2021-09-23 NOTE — Plan of Care (Signed)
  Problem: Education: Goal: Knowledge of General Education information will improve Description: Including pain rating scale, medication(s)/side effects and non-pharmacologic comfort measures Outcome: Progressing   Problem: Clinical Measurements: Goal: Will remain free from infection Outcome: Progressing   Problem: Activity: Goal: Risk for activity intolerance will decrease Outcome: Progressing   Problem: Nutrition: Goal: Adequate nutrition will be maintained Outcome: Progressing   Problem: Coping: Goal: Level of anxiety will decrease Outcome: Progressing   Problem: Elimination: Goal: Will not experience complications related to bowel motility Outcome: Progressing   Problem: Pain Managment: Goal: General experience of comfort will improve Outcome: Progressing   

## 2021-09-23 NOTE — Evaluation (Addendum)
Occupational Therapy Evaluation Patient Details Name: Alisha Henderson MRN: [id number] DOB: [birth year] Today's Date: 09/23/2021   History of Present Illness Pt is a 70 y.o. F who presents for evaluation of back pain. X-ray lumbar spine 09/02/2021 showing diffuse compression fractures from L2-5 that do not appear to be different from films obtained in 2021. Significant PMH: chronic back pain, anxiety, IBS.   Clinical Impression   Pt presents with decreased balance, strength, activity tolerance, cognition, and pain. Pt attempted bed mobility, however unable to sit EOB due to pain (see details below). Based on presentation, pt likely requiring Mod - Total A for ADLs at this time. MD spoke with pt while OT in room and is ordering brace to support back and hopefully ease pain with mobility. Currently recommending SNF for further rehab prior to return home, however pt independent, working, and driving at baseline and functional performance may improve significantly once pain is managed. Will follow acutely and update d/c plan as needed pending pt progress.      Recommendations for follow up therapy are one component of a multi-disciplinary discharge planning process, led by the attending physician.  Recommendations may be updated based on patient status, additional functional criteria and insurance authorization.   Follow Up Recommendations  Skilled nursing-short term rehab (<3 hours/day)    Assistance Recommended at Discharge Frequent or constant Supervision/Assistance  Functional Status Assessment  Patient has had a recent decline in their functional status and demonstrates the ability to make significant improvements in function in a reasonable and predictable amount of time.  Equipment Recommendations  Other (comment) (TBD)    Recommendations for Other Services       Precautions / Restrictions Precautions Precautions: Fall Restrictions Weight Bearing Restrictions: No      Mobility Bed  Mobility Overal bed mobility: Needs Assistance Bed Mobility: Supine to Sit     Supine to sit: Mod assist     General bed mobility comments: Pt able to get LEs to EOB with extended time. Using rail and OT hand held Mod A and supporting trunk to pull to sitting position. Pt almost made it to sitting, however reported too much pain and lied back down. Declining further mobility at this time.    Transfers                          Balance                                           ADL either performed or assessed with clinical judgement   ADL Overall ADL's : Needs assistance/impaired Eating/Feeding: Set up;Bed level   Grooming: Set up;Bed level   Upper Body Bathing: Moderate assistance   Lower Body Bathing: Maximal assistance   Upper Body Dressing : Moderate assistance   Lower Body Dressing: Maximal assistance       Toileting- Clothing Manipulation and Hygiene: Total assistance         General ADL Comments: Pt limited by pain, balance, weakness, and activity tolerance. Attempted supine to sit transfer, however pt states too much pain and lies back down. Based on presentation, pt likely requiring significant assistance with ADLs at this time.     Vision   Vision Assessment?: Vision impaired- to be further tested in functional context Additional Comments: Pt reports she has cataracts and noted to have visual deficits on  L side during eval.     Perception     Praxis      Pertinent Vitals/Pain Pain Assessment: 0-10 Pain Score: 8  Pain Location: lower back, L thigh Pain Descriptors / Indicators: Grimacing;Guarding;Spasm Pain Intervention(s): Limited activity within patient's tolerance;Monitored during session     Hand Dominance     Extremity/Trunk Assessment Upper Extremity Assessment Upper Extremity Assessment: Overall WFL for tasks assessed   Lower Extremity Assessment Lower Extremity Assessment: Defer to PT evaluation        Communication Communication Communication: No difficulties   Cognition Arousal/Alertness: Awake/alert Behavior During Therapy: WFL for tasks assessed/performed Overall Cognitive Status: Impaired/Different from baseline Area of Impairment: Following commands                       Following Commands: Follows one step commands inconsistently             General Comments       Exercises     Shoulder Instructions      Home Living Family/patient expects to be discharged to:: Private residence Living Arrangements: Alone   Type of Home: House Home Access: Stairs to enter Technical brewer of Steps: 5   Home Layout: One level     Bathroom Shower/Tub: Occupational psychologist: [place]: Toilet riser          Prior Functioning/Environment Prior Level of Function : Independent/Modified Independent;Working/employed;Driving                        OT Problem List: Decreased strength;Decreased activity tolerance;Impaired balance (sitting and/or standing);Decreased cognition;Decreased safety awareness;Decreased knowledge of use of DME or AE;Decreased knowledge of precautions;Pain      OT Treatment/Interventions: Self-care/ADL training;Therapeutic exercise;Neuromuscular education;DME and/or AE instruction;Therapeutic activities;Patient/family education;Balance training;Cognitive remediation/compensation    OT Goals(Current goals can be found in the care plan section) Acute Rehab OT Goals Patient Stated Goal: Wash my hair OT Goal Formulation: With patient Time For Goal Achievement: 10/07/21 Potential to Achieve Goals: Good  OT Frequency: Min 2X/week   Barriers to D/C:            Co-evaluation              AM-PAC OT "6 Clicks" Daily Activity     Outcome Measure Help from another person eating meals?: A Little Help from another person taking care of personal grooming?: A Little Help from another person  toileting, which includes using toliet, bedpan, or urinal?: Total Help from another person bathing (including washing, rinsing, drying)?: A Lot Help from another person to put on and taking off regular upper body clothing?: A Lot Help from another person to put on and taking off regular lower body clothing?: A Lot 6 Click Score: 13   End of Session Nurse Communication: Mobility status  Activity Tolerance: Patient limited by pain Patient left: in bed;with bed alarm set;with call bell/phone within reach  OT Visit Diagnosis: Unsteadiness on feet (R26.81);Other abnormalities of gait and mobility (R26.89);Muscle weakness (generalized) (M62.81);Other symptoms and signs involving cognitive function;Pain                Time: 1129-1151 OT Time Calculation (min): 22 min Charges:  OT General Charges $OT Visit: 1 Visit OT Evaluation $OT Eval Moderate Complexity: 1 Mod  Landen Knoedler C, OT/L  Acute Rehab [place] 09/23/2021, 12:07 PM

## 2021-09-23 NOTE — Consult Note (Signed)
Reason for Consult: Thoracic fracture Referring Physician: Dr. Gus Puma is an 70 y.o. female.  HPI: 70 year old female with known osteoporosis and prior L2, L3-L4 and L5 osteoporotic vertebral body compression fractures which she has been treated nonoperatively in the past.  Patient with chronic back pain secondary to these fractures and other osteoarthritic/spondylitic disease.  The patient suffered acute worsening of her back pain approximately 4 days ago upon stretching while in bed.  The pain was severe in her upper lumbar/lower thoracic region.  She reported some transient radiating numbness but this has resolved.  She has some chronic numbness and tingling distal to both lower extremities related to peripheral neuropathy.  She currently is having no new numbness or paresthesias.  She denies any weakness.  She is having no bowel or bladder dysfunction.  Her pain is eased considerably over the last 24 hours but still is present with movement.  Past Medical History:  Diagnosis Date   Degenerative disc disease, lumbar    Mitral valve prolapse     History reviewed. No pertinent surgical history.  History reviewed. No pertinent family history.  Social History:  reports that she has never smoked. She has never used smokeless tobacco. She reports that she does not drink alcohol and does not use drugs.  Allergies:  Allergies  Allergen Reactions   Cephalosporins Anaphylaxis   Sulfa Antibiotics Anaphylaxis   Sulfites Anaphylaxis   Cephalexin Itching   Ciprofloxacin    Codeine Nausea And Vomiting   Other     Antibiotics cause anaphylaxis   Penicillins     Has patient had a PCN reaction causing immediate rash, facial/tongue/throat swelling, SOB or lightheadedness with hypotension: Yes- Anaphylaxis  Has patient had a PCN reaction causing severe rash involving mucus membranes or skin necrosis: No Has patient had a PCN reaction that required hospitalization No Has patient had  a PCN reaction occurring within the last 10 years: No If all of the above answers are "NO", then may proceed with Cephalosporin use.     Medications: I have reviewed the patient's current medications.  Results for orders placed or performed during the hospital encounter of 09/20/21 (from the past 48 hour(s))  Resp Panel by RT-PCR (Flu A&B, Covid) Nasopharyngeal Swab     Status: None   Collection Time: 09/21/21  8:52 PM   Specimen: Nasopharyngeal Swab; Nasopharyngeal(NP) swabs in vial transport medium  Result Value Ref Range   SARS Coronavirus 2 by RT PCR NEGATIVE NEGATIVE    Comment: (NOTE) SARS-CoV-2 target nucleic acids are NOT DETECTED.  The SARS-CoV-2 RNA is generally detectable in upper respiratory specimens during the acute phase of infection. The lowest concentration of SARS-CoV-2 viral copies this assay can detect is 138 copies/mL. A negative result does not preclude SARS-Cov-2 infection and should not be used as the sole basis for treatment or other patient management decisions. A negative result may occur with  improper specimen collection/handling, submission of specimen other than nasopharyngeal swab, presence of viral mutation(s) within the areas targeted by this assay, and inadequate number of viral copies(<138 copies/mL). A negative result must be combined with clinical observations, patient history, and epidemiological information. The expected result is Negative.  Fact Sheet for Patients:  EntrepreneurPulse.com.au  Fact Sheet for Healthcare Providers:  IncredibleEmployment.be  This test is no t yet approved or cleared by the [place] FDA and  has been authorized for detection and/or diagnosis of SARS-CoV-2 by FDA under an Emergency Use Authorization (EUA). This EUA will remain  in effect (meaning this test can be used) for the duration of the COVID-19 declaration under Section 564(b)(1) of the Act, 21 U.S.C.section  360bbb-3(b)(1), unless the authorization is terminated  or revoked sooner.       Influenza A by PCR NEGATIVE NEGATIVE   Influenza B by PCR NEGATIVE NEGATIVE    Comment: (NOTE) The Xpert Xpress SARS-CoV-2/FLU/RSV plus assay is intended as an aid in the diagnosis of influenza from Nasopharyngeal swab specimens and should not be used as a sole basis for treatment. Nasal washings and aspirates are unacceptable for Xpert Xpress SARS-CoV-2/FLU/RSV testing.  Fact Sheet for Patients: EntrepreneurPulse.com.au  Fact Sheet for Healthcare Providers: IncredibleEmployment.be  This test is not yet approved or cleared by the [place] FDA and has been authorized for detection and/or diagnosis of SARS-CoV-2 by FDA under an Emergency Use Authorization (EUA). This EUA will remain in effect (meaning this test can be used) for the duration of the COVID-19 declaration under Section 564(b)(1) of the Act, 21 U.S.C. section 360bbb-3(b)(1), unless the authorization is terminated or revoked.  Performed at [facility], [place] [address], [place], [place] [address]   Basic metabolic panel     Status: Abnormal   Collection Time: 09/22/21  9:53 AM  Result Value Ref Range   Sodium 131 (L) 135 - 145 mmol/L   Potassium 3.5 3.5 - 5.1 mmol/L   Chloride 98 98 - 111 mmol/L   CO2 20 (L) 22 - 32 mmol/L   Glucose, Bld 95 70 - 99 mg/dL    Comment: Glucose reference range applies only to samples taken after fasting for at least 8 hours.   BUN 6 (L) 8 - 23 mg/dL   Creatinine, Ser 0.69 0.44 - 1.00 mg/dL   Calcium 8.7 (L) 8.9 - 10.3 mg/dL   GFR, Estimated >60 >60 mL/min    Comment: (NOTE) Calculated using the CKD-EPI Creatinine Equation (2021)    Anion gap 13 5 - 15    Comment: Performed at [facility] [address], [place], [place] [address]  CBC     Status: Abnormal   Collection Time: 09/22/21  9:53 AM  Result Value Ref Range   WBC 5.6 4.0 - 10.5 K/uL    RBC 5.37 (H) 3.87 - 5.11 MIL/uL   Hemoglobin 14.1 12.0 - 15.0 g/dL   HCT 42.2 36.0 - 46.0 %   MCV 78.6 (L) 80.0 - 100.0 fL   MCH 26.3 26.0 - 34.0 pg   MCHC 33.4 30.0 - 36.0 g/dL   RDW 14.6 11.5 - 15.5 %   Platelets 274 150 - 400 K/uL   nRBC 0.0 0.0 - 0.2 %    Comment: Performed at [facility], [place] [address] [place], [place] [address]  Basic metabolic panel     Status: Abnormal   Collection Time: 09/23/21  2:02 AM  Result Value Ref Range   Sodium 128 (L) 135 - 145 mmol/L   Potassium 3.1 (L) 3.5 - 5.1 mmol/L   Chloride 98 98 - 111 mmol/L   CO2 19 (L) 22 - 32 mmol/L   Glucose, Bld 86 70 - 99 mg/dL    Comment: Glucose reference range applies only to samples taken after fasting for at least 8 hours.   BUN 8 8 - 23 mg/dL   Creatinine, Ser 0.67 0.44 - 1.00 mg/dL   Calcium 8.6 (L) 8.9 - 10.3 mg/dL   GFR, Estimated >60 >60 mL/min    Comment: (NOTE) Calculated  using the CKD-EPI Creatinine Equation (2021)    Anion gap 11 5 - 15    Comment: Performed at [facility], [place] [address], [place], [place] [address]    DG Pelvis 1-2 Views  Result Date: 09/21/2021 CLINICAL DATA:  Increasing back pain.  Muscle spasms. EXAM: PELVIS - 1-2 VIEW; LEFT FEMUR 2 VIEWS COMPARISON:  Pelvic radiograph 09/06/2020, left knee radiograph 09/04/2019 FINDINGS: Diffuse osteopenia. There is no evidence of pelvic fracture or diastasis. No pelvic bone lesions are seen. Chronic compression fracture of the L5 vertebral body. No fracture of the left femur. No left hip or left knee dislocation. Soft tissues are unremarkable. IMPRESSION: No acute osseous abnormality of the pelvis or left femur. Electronically Signed   By: Ileana Roup M.D.   On: 09/21/2021 12:49   MR LUMBAR SPINE WO CONTRAST  Result Date: 09/22/2021 CLINICAL DATA:  Compression fracture EXAM: MRI LUMBAR SPINE WITHOUT CONTRAST TECHNIQUE: Multiplanar, multisequence MR imaging of the lumbar spine was performed. No intravenous contrast was  administered. COMPARISON:  Lumbar spine MRI 11/04/2020 FINDINGS: Segmentation:  Standard; the lowest disc space is designated L5-S1 Alignment: There is mild straightening of the normal lumbar lordosis. There is no antero or retrolisthesis. Vertebrae: There is an acute fracture of the T10 vertebral body with mild loss of vertebral body height centrally. There is no significant bony retropulsion. There is an additional acute fracture of the T11 vertebral body with up to approximately 50% loss of vertebral body height centrally. There is moderate bony retropulsion at this level with mild mass effect on the cord. Additional compression deformities of the L2 through L5 vertebral bodies are not significantly changed compared to the prior MRI from 11/04/2020, most severe at L5. There is mild bony retropulsion at L2 and L5 without significant spinal canal stenosis or mass effect on the cauda equina nerve roots. Conus medullaris and cauda equina: Conus extends to the L1-L2 level. Conus and cauda equina appear normal. Paraspinal and other soft tissues: The paraspinal soft tissues are unremarkable. Disc levels: T10-T11: As above, there is moderate bony retropulsion of the T11 vertebral body resulting in mild-to-moderate spinal canal stenosis with the formation of the cord. There is no cord signal abnormality. T11-T12: No significant spinal canal or neural foraminal stenosis. T12-L1: No significant spinal canal or neural foraminal stenosis. L1-L2: There is mild bony retropulsion of the posterosuperior L2 endplate resulting in minimal spinal canal stenosis without significant neural foraminal stenosis. L2-L3: There is mild degenerative endplate change and bilateral facet arthropathy without significant spinal canal or neural foraminal stenosis. L3-L4: There is mild degenerative endplate change and bilateral facet arthropathy without significant spinal canal or neural foraminal stenosis. L4-L5: There is mild bony retropulsion of  the posterosuperior L5 endplate with superimposed degenerative endplate change and facet arthropathy, without significant spinal canal or neural foraminal stenosis. L5-S1: There is a mild central disc protrusion, degenerative endplate change, and bilateral facet arthropathy without significant spinal canal or neural foraminal stenosis. IMPRESSION: 1. Acute fracture of the T10 vertebral body with mild loss of vertebral body height centrally but no bony retropulsion. 2. Acute compression fracture of the T11 vertebral body with up to approximately 50% loss of vertebral body height centrally and moderate bony retropulsion. There is resultant mild-to-moderate spinal canal stenosis with deformation of the cord but no cord signal abnormality. 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 Follow up postcontrast lumbar spine MRI in 2-3 months may be considered to exclude underlying lesion. 3. Other compression fractures  at the L2 through L5 vertebral bodies are not significantly changed compared to the MRI from 11/05/2019. 4. Otherwise, multilevel degenerative changes throughout the lumbar spine are detailed above without other high-grade spinal canal or neural foraminal stenosis. Electronically Signed   By: Valetta Mole M.D.   On: 09/22/2021 14:34   DG Femur Min 2 Views Left  Result Date: 09/21/2021 CLINICAL DATA:  Increasing back pain.  Muscle spasms. EXAM: PELVIS - 1-2 VIEW; LEFT FEMUR 2 VIEWS COMPARISON:  Pelvic radiograph 09/06/2020, left knee radiograph 09/04/2019 FINDINGS: Diffuse osteopenia. There is no evidence of pelvic fracture or diastasis. No pelvic bone lesions are seen. Chronic compression fracture of the L5 vertebral body. No fracture of the left femur. No left hip or left knee dislocation. Soft tissues are unremarkable. IMPRESSION: No acute osseous abnormality of the pelvis or left femur. Electronically Signed   By: Ileana Roup M.D.   On: 09/21/2021 12:49    Pertinent items noted  in HPI and remainder of comprehensive ROS otherwise negative. Blood pressure (!) 150/93, pulse 91, temperature 97.8 F (36.6 C), temperature source Oral, resp. rate 20, SpO2 100 %. The patient is an elderly frail female who is lying comfortably in bed currently.  She appears to be in no distress.  She is awake and alert.  She is oriented and appropriate.  Her speech is fluent.  Judgment insight are intact.  Her cranial nerve function is normal bilaterally.  Motor examination with 5/5 strength bilaterally.  Sensory examination with some patchy distal sensory loss in both distal lower extremities in a stocking type distribution.  Reflexes are hypoactive but symmetric.  Some mild discomfort with straight raising on the right.  Examination head ears eyes nose and throat is unremarked.  Chest and abdomen are benign.  Extremities are free from injury deformity.  Assessment/Plan: Patient with acute T10 and T11 compression fractures the most severe which is at T11 where there is significant loss of height and some mild stenosis but no serious cord compression.  Given her age, functional status and current relative improvement I would continue with nonoperative management for now.  I would recommend TLSO bracing and mobilization with therapy.  Should the patient's pain continue to be intractable over the next week then I would consider kyphoplasty vertebroplasty but I am hopeful that we can get through this fracture without having to result to operative intervention.  Mallie Mussel A Kiva Norland 09/23/2021, 11:49 AM

## 2021-09-23 NOTE — Progress Notes (Signed)
PROGRESS NOTE    Alisha Henderson  AYT:[id number] DOB: 09/19/1951 DOA: 09/20/2021 PCP: Shirline Frees, MD   Chief Complaint  Patient presents with   Back Pain  Brief Narrative/Hospital Course: Alisha Henderson, 70 y.o. female with PMH of  low back pain with compression fractures presenting with worsening pain after an episode at home where CT "heart her back".  Patient reports she pulled a muscle while sitting herself in the bed and since then she cannot walk or stand so came to the ED.  She has been dealing with low back pain for a while, she was seen by ortho on 10/4 with L2-L5 compression fractures and thoracic kyphosis; refused kyphoplasty , she was sent to PT and pain management. She was seen in the ED x-ray pelvis and femur no acute finding and admitted for further work-up.   Subjective: Seen this morning.  Patient is alert awake and oriented today Reports she was confused yesterday from tramadol. 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  Appreciate Dr. Durward Fortes input, repeat MRI-acute fracture of the T10 with mild loss of vertebral body, acute compression fracture of T11 approximately 50% of loss of height-mild to moderate spinal canal stenosis, other compression fractures at L2-L5 vertebral body not changed from 11/05/2019.  Continue on oral and IV pain management but patient very particular about the medication and has been refusing the meds.  Discussed with Dr. Durward Fortes recommending neurosurgical consultation, notified neurosurgery team for evaluation. Continue muscle relaxant.  Elevated blood pressure: Likely due to pain.  Blood pressure stable now discontinue hydralazine.  Continue her propanolol.    Anxiety  disorder on Ativan and propanolol.  Was refusing to  take her p.o. 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  But also has poor intake.  Check urine electrolytes sodium fluctuating keep on IV fluids repeat labs. Recent Labs  Lab 09/21/21 0620 09/22/21 0953 09/23/21 0202  NA 129* 131* 355*    Mild metabolic acidosis bicarb 19, monitor   Severe malnutrition in the context of chronic illness augment diet as below Nutrition Problem: Severe Malnutrition Etiology: chronic illness (chronic back pain) Signs/Symptoms: severe fat depletion, severe muscle depletion Interventions: Magic cup, MVI, Carnation Instant Breakfast, Other (Comment) (Pudding)    DVT prophylaxis: Place and maintain sequential compression device Start: 09/23/21 0817-we will hold in case he needs vertebroplasty add SCD Code Status:   Code Status: Full Code Family Communication: plan of care discussed with patient at bedside. Status is: Observation  Remains inpatient appropriate because: Ongoing management of low back pain further orthopedic input Patient is from home lives alone. Patient is currently is not medically stable, has not been able to stand or ambulate due to pain.  Anticipate discharge:TBD   Objective: Vitals last 24 hrs: Vitals:   09/22/21 1625 09/22/21 2125 09/23/21 0637 09/23/21 0821  BP: (!) 169/65 (!) 141/50 95/67 (!) 150/93  Pulse: 84 85 88 91  Resp:  20 20 20   Temp:  97.9 F (36.6 C) 98.7 F (37.1 C) 97.8 F (36.6 C)  TempSrc:  Oral Oral Oral  SpO2:  100% 100% 100%   Weight change:   Intake/Output Summary (Last 24 hours) at 09/23/2021 1120 Last data filed at 09/22/2021 1500 Gross per 24 hour  Intake 1757.16 ml  Output --  Net 1757.16 ml   Net IO Since Admission: 2,257.16 mL [09/23/21  1120]   Physical Examination: General exam: AAOx 3, thin cachectic and frail.  HEENT:Oral mucosa moist, Ear/Nose WNL grossly, dentition normal. Respiratory system: bilaterally clear breath sounds, no use of accessory muscle Cardiovascular system: S1 & S2 +, No JVD,. Gastrointestinal system: Abdomen soft, NT,ND, BS+ Nervous System:Alert, awake, moving extremities and grossly nonfocal Extremities: no edema, distal peripheral pulses palpable.  Skin: No rashes,no icterus. MSK: Normal muscle bulk,tone, power   Medications reviewed:  Scheduled Meds:  docusate sodium  100 mg Oral BID   LORazepam  0.5 mg Intravenous Q6H   multivitamin with minerals  1 tablet Oral Daily   propranolol  10 mg Oral BID   propranolol  5 mg Oral QHS   Continuous Infusions:  lactated ringers 100 mL/hr at 09/22/21 1828   methocarbamol (ROBAXIN) IV Stopped (09/22/21 0152)   Diet Order             Diet NPO time specified Except for: Sips with Meds, Ice Chips  Diet effective now                   Nutrition Problem: Severe Malnutrition Etiology: chronic illness (chronic back pain) Signs/Symptoms: severe fat depletion, severe muscle depletion Interventions: Magic cup, MVI, Carnation Instant Breakfast, Other (Comment) (Pudding)  Weight change:   Wt Readings from Last 3 Encounters:  11/20/20 53.5 kg  09/24/20 53.5 kg  02/06/20 56.1 kg     Consultants:see note  Procedures:see note Antimicrobials: Anti-infectives (From admission, onward)    None      Culture/Microbiology No results found for: SDES, SPECREQUEST, CULT, REPTSTATUS  Other culture-see note  Unresulted Labs (From admission, onward)     Start     Ordered   09/23/21 0817  Na and K (sodium & potassium), rand urine  Once,   R        09/23/21 0816   09/23/21 0817  Osmolality, urine  Once,   R        09/23/21 0816   09/23/21 1443  Basic metabolic panel  Daily,   R     Question:  Specimen collection method  Answer:  Lab=Lab collect    09/22/21 0805           Data Reviewed: I have personally reviewed following labs and imaging studies CBC: Recent Labs  Lab 09/21/21 0620 09/22/21 0953  WBC 4.6 5.6  NEUTROABS 3.7  --   HGB 14.8 14.1  HCT 44.6 42.2  MCV 79.9* 78.6*  PLT 299 154   Basic Metabolic Panel: Recent Labs  Lab 09/21/21 0620 09/22/21 0953 09/23/21 0202  NA 129* 131* 128*  K 3.6 3.5 3.1*  CL 95* 98 98  CO2 18* 20* 19*  GLUCOSE 108* 95 86  BUN 8 6* 8  CREATININE 0.79 0.69 0.67  CALCIUM 9.5 8.7* 8.6*   GFR: CrCl cannot be calculated (Unknown ideal weight.). Liver Function Tests: No results for input(s): AST, ALT, ALKPHOS, BILITOT, PROT, ALBUMIN in the last 168 hours. No results for input(s): LIPASE, AMYLASE in the last 168 hours. No results for input(s): AMMONIA in the last 168 hours. Coagulation Profile: No results for input(s): INR, PROTIME in the last 168 hours. Cardiac Enzymes: No results for  input(s): CKTOTAL, CKMB, CKMBINDEX, TROPONINI in the last 168 hours. BNP (last 3 results) No results for input(s): PROBNP in the last 8760 hours. HbA1C: No results for input(s): HGBA1C in the last 72 hours. CBG: No results for input(s): GLUCAP in the last 168 hours. Lipid Profile: No results for input(s): CHOL, HDL, LDLCALC, TRIG, CHOLHDL, LDLDIRECT in the last 72 hours. Thyroid Function Tests: No results for input(s): TSH, T4TOTAL, FREET4, T3FREE, THYROIDAB in the last 72 hours. Anemia Panel: No results for input(s): VITAMINB12, FOLATE, FERRITIN, TIBC, IRON, RETICCTPCT in the last 72 hours. Sepsis Labs: No results for input(s): PROCALCITON, LATICACIDVEN in the last 168 hours.  Recent Results (from the past 240 hour(s))  Resp Panel by RT-PCR (Flu A&B, Covid) Nasopharyngeal Swab     Status: None   Collection Time: 09/21/21  8:52 PM   Specimen: Nasopharyngeal Swab; Nasopharyngeal(NP) swabs in vial transport medium  Result Value Ref Range Status   SARS Coronavirus 2 by RT PCR NEGATIVE NEGATIVE  Final    Comment: (NOTE) SARS-CoV-2 target nucleic acids are NOT DETECTED.  The SARS-CoV-2 RNA is generally detectable in upper respiratory specimens during the acute phase of infection. The lowest concentration of SARS-CoV-2 viral copies this assay can detect is 138 copies/mL. A negative result does not preclude SARS-Cov-2 infection and should not be used as the sole basis for treatment or other patient management decisions. A negative result may occur with  improper specimen collection/handling, submission of specimen other than nasopharyngeal swab, presence of viral mutation(s) within the areas targeted by this assay, and inadequate number of viral copies(<138 copies/mL). A negative result must be combined with clinical observations, patient history, and epidemiological information. The expected result is Negative.  Fact Sheet for Patients:  EntrepreneurPulse.com.au  Fact Sheet for Healthcare Providers:  IncredibleEmployment.be  This test is no t yet approved or cleared by the [place] FDA and  has been authorized for detection and/or diagnosis of SARS-CoV-2 by FDA under an Emergency Use Authorization (EUA). This EUA will remain  in effect (meaning this test can be used) for the duration of the COVID-19 declaration under Section 564(b)(1) of the Act, 21 U.S.C.section 360bbb-3(b)(1), unless the authorization is terminated  or revoked sooner.       Influenza A by PCR NEGATIVE NEGATIVE Final   Influenza B by PCR NEGATIVE NEGATIVE Final    Comment: (NOTE) The Xpert Xpress SARS-CoV-2/FLU/RSV plus assay is intended as an aid in the diagnosis of influenza from Nasopharyngeal swab specimens and should not be used as a sole basis for treatment. Nasal washings and aspirates are unacceptable for Xpert Xpress SARS-CoV-2/FLU/RSV testing.  Fact Sheet for Patients: EntrepreneurPulse.com.au  Fact Sheet for Healthcare  Providers: IncredibleEmployment.be  This test is not yet approved or cleared by the [place] FDA and has been authorized for detection and/or diagnosis of SARS-CoV-2 by FDA under an Emergency Use Authorization (EUA). This EUA will remain in effect (meaning this test can be used) for the duration of the COVID-19 declaration under Section 564(b)(1) of the Act, 21 U.S.C. section 360bbb-3(b)(1), unless the authorization is terminated or revoked.  Performed at [facility], [place] [address], [place], [place] [address]      Radiology Studies: DG Pelvis 1-2 Views  Result Date: 09/21/2021 CLINICAL DATA:  Increasing back pain.  Muscle spasms. EXAM: PELVIS - 1-2 VIEW; LEFT FEMUR 2 VIEWS COMPARISON:  Pelvic radiograph 09/06/2020, left knee radiograph 09/04/2019 FINDINGS: Diffuse osteopenia. There is no evidence of pelvic fracture or diastasis. No pelvic  bone lesions are seen. Chronic compression fracture of the L5 vertebral body. No fracture of the left femur. No left hip or left knee dislocation. Soft tissues are unremarkable. IMPRESSION: No acute osseous abnormality of the pelvis or left femur. Electronically Signed   By: Ileana Roup M.D.   On: 09/21/2021 12:49   MR LUMBAR SPINE WO CONTRAST  Result Date: 09/22/2021 CLINICAL DATA:  Compression fracture EXAM: MRI LUMBAR SPINE WITHOUT CONTRAST TECHNIQUE: Multiplanar, multisequence MR imaging of the lumbar spine was performed. No intravenous contrast was administered. COMPARISON:  Lumbar spine MRI 11/04/2020 FINDINGS: Segmentation:  Standard; the lowest disc space is designated L5-S1 Alignment: There is mild straightening of the normal lumbar lordosis. There is no antero or retrolisthesis. Vertebrae: There is an acute fracture of the T10 vertebral body with mild loss of vertebral body height centrally. There is no significant bony retropulsion. There is an additional acute fracture of the T11 vertebral body with up to  approximately 50% loss of vertebral body height centrally. There is moderate bony retropulsion at this level with mild mass effect on the cord. Additional compression deformities of the L2 through L5 vertebral bodies are not significantly changed compared to the prior MRI from 11/04/2020, most severe at L5. There is mild bony retropulsion at L2 and L5 without significant spinal canal stenosis or mass effect on the cauda equina nerve roots. Conus medullaris and cauda equina: Conus extends to the L1-L2 level. Conus and cauda equina appear normal. Paraspinal and other soft tissues: The paraspinal soft tissues are unremarkable. Disc levels: T10-T11: As above, there is moderate bony retropulsion of the T11 vertebral body resulting in mild-to-moderate spinal canal stenosis with the formation of the cord. There is no cord signal abnormality. T11-T12: No significant spinal canal or neural foraminal stenosis. T12-L1: No significant spinal canal or neural foraminal stenosis. L1-L2: There is mild bony retropulsion of the posterosuperior L2 endplate resulting in minimal spinal canal stenosis without significant neural foraminal stenosis. L2-L3: There is mild degenerative endplate change and bilateral facet arthropathy without significant spinal canal or neural foraminal stenosis. L3-L4: There is mild degenerative endplate change and bilateral facet arthropathy without significant spinal canal or neural foraminal stenosis. L4-L5: There is mild bony retropulsion of the posterosuperior L5 endplate with superimposed degenerative endplate change and facet arthropathy, without significant spinal canal or neural foraminal stenosis. L5-S1: There is a mild central disc protrusion, degenerative endplate change, and bilateral facet arthropathy without significant spinal canal or neural foraminal stenosis. IMPRESSION: 1. Acute fracture of the T10 vertebral body with mild loss of vertebral body height centrally but no bony retropulsion. 2.  Acute compression fracture of the T11 vertebral body with up to approximately 50% loss of vertebral body height centrally and moderate bony retropulsion. There is resultant mild-to-moderate spinal canal stenosis with deformation of the cord but no cord signal abnormality. 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 Follow up postcontrast lumbar spine MRI in 2-3 months may be considered to exclude underlying lesion. 3. Other compression fractures at the L2 through L5 vertebral bodies are not significantly changed compared to the MRI from 11/05/2019. 4. Otherwise, multilevel degenerative changes throughout the lumbar spine are detailed above without other high-grade spinal canal or neural foraminal stenosis. Electronically Signed   By: Valetta Mole M.D.   On: 09/22/2021 14:34   DG Femur Min 2 Views Left  Result Date: 09/21/2021 CLINICAL DATA:  Increasing back pain.  Muscle spasms. EXAM: PELVIS - 1-2 VIEW; LEFT FEMUR 2 VIEWS COMPARISON:  Pelvic radiograph 09/06/2020, left knee radiograph 09/04/2019 FINDINGS: Diffuse osteopenia. There is no evidence of pelvic fracture or diastasis. No pelvic bone lesions are seen. Chronic compression fracture of the L5 vertebral body. No fracture of the left femur. No left hip or left knee dislocation. Soft tissues are unremarkable. IMPRESSION: No acute osseous abnormality of the pelvis or left femur. Electronically Signed   By: Ileana Roup M.D.   On: 09/21/2021 12:49     LOS: 1 day   Antonieta Pert, MD [facility]  09/23/2021, 11:20 AM

## 2021-09-23 NOTE — Plan of Care (Signed)

## 2021-09-23 NOTE — Progress Notes (Signed)
Orthopedic Tech Progress Note Patient Details:  Alisha Henderson 07-15-1951 [id number]  Ortho Devices Type of Ortho Device: Lumbar corsett Ortho Device/Splint Location: BACK Ortho Device/Splint Interventions: Ordered, Adjustment   Post Interventions Patient Tolerated: Well Instructions Provided: Care of device  Janit Pagan 09/23/2021, 12:40 PM

## 2021-09-24 DIAGNOSIS — Z79899 Other long term (current) drug therapy: Secondary | ICD-10-CM | POA: Diagnosis not present

## 2021-09-24 DIAGNOSIS — M62830 Muscle spasm of back: Secondary | ICD-10-CM | POA: Diagnosis not present

## 2021-09-24 DIAGNOSIS — S22088A Other fracture of T11-T12 vertebra, initial encounter for closed fracture: Secondary | ICD-10-CM | POA: Diagnosis not present

## 2021-09-24 DIAGNOSIS — I10 Essential (primary) hypertension: Secondary | ICD-10-CM | POA: Diagnosis not present

## 2021-09-24 DIAGNOSIS — M549 Dorsalgia, unspecified: Secondary | ICD-10-CM | POA: Diagnosis not present

## 2021-09-24 DIAGNOSIS — Z20822 Contact with and (suspected) exposure to covid-19: Secondary | ICD-10-CM | POA: Diagnosis not present

## 2021-09-24 DIAGNOSIS — X58XXXA Exposure to other specified factors, initial encounter: Secondary | ICD-10-CM | POA: Diagnosis not present

## 2021-09-24 DIAGNOSIS — S3992XA Unspecified injury of lower back, initial encounter: Secondary | ICD-10-CM | POA: Diagnosis not present

## 2021-09-24 LAB — BASIC METABOLIC PANEL
Anion gap: 7 (ref 5–15)
BUN: 9 mg/dL (ref 8–23)
CO2: 24 mmol/L (ref 22–32)
Calcium: 8.3 mg/dL — ABNORMAL LOW (ref 8.9–10.3)
Chloride: 99 mmol/L (ref 98–111)
Creatinine, Ser: 0.62 mg/dL (ref 0.44–1.00)
GFR, Estimated: >60 mL/min (ref >60)
Glucose, Bld: 88 mg/dL (ref 70–99)
Potassium: 2.9 mmol/L — ABNORMAL LOW (ref 3.5–5.1)
Sodium: 130 mmol/L — ABNORMAL LOW (ref 135–145)

## 2021-09-24 LAB — MAGNESIUM: Magnesium: 1.6 mg/dL — ABNORMAL LOW (ref 1.7–2.4)

## 2021-09-24 MED ORDER — ENOXAPARIN SODIUM 30 MG/0.3ML IJ SOSY
30.0000 mg | PREFILLED_SYRINGE | Freq: Every day | INTRAMUSCULAR | Status: DC
  Filled 2021-09-24 (×5): qty 0.3

## 2021-09-24 MED ORDER — POTASSIUM CHLORIDE 20 MEQ PO PACK
40.0000 meq | PACK | Freq: Two times a day (BID) | ORAL | Status: DC
  Administered 2021-09-24 (×2): 40 meq via ORAL
  Filled 2021-09-24 (×2): qty 2

## 2021-09-24 MED ORDER — ENOXAPARIN SODIUM 40 MG/0.4ML IJ SOSY: 40.0000 mg | PREFILLED_SYRINGE | INTRAMUSCULAR | Status: DC

## 2021-09-24 MED ORDER — POTASSIUM CHLORIDE CRYS ER 20 MEQ PO TBCR
40.0000 meq | EXTENDED_RELEASE_TABLET | Freq: Two times a day (BID) | ORAL | Status: DC
  Filled 2021-09-24: qty 2

## 2021-09-24 MED ORDER — POTASSIUM CHLORIDE 2 MEQ/ML IV SOLN
INTRAVENOUS | Status: DC
  Filled 2021-09-24: qty 1000

## 2021-09-24 MED ORDER — POLYVINYL ALCOHOL 1.4 % OP SOLN
1.0000 [drp] | Freq: Every day | OPHTHALMIC | Status: DC
  Administered 2021-09-24 – 2021-09-30 (×23): 1 [drp] via OPHTHALMIC
  Filled 2021-09-24: qty 15

## 2021-09-24 MED ORDER — SODIUM CHLORIDE 1 G PO TABS
1.0000 g | ORAL_TABLET | Freq: Two times a day (BID) | ORAL | Status: DC
  Filled 2021-09-24: qty 1

## 2021-09-24 MED ORDER — SODIUM CHLORIDE 1 G PO TABS
2.0000 g | ORAL_TABLET | Freq: Two times a day (BID) | ORAL | Status: DC
  Administered 2021-09-24 – 2021-09-29 (×3): 2 g via ORAL
  Filled 2021-09-24 (×13): qty 2

## 2021-09-24 MED ORDER — METHOCARBAMOL 500 MG PO TABS
500.0000 mg | ORAL_TABLET | Freq: Four times a day (QID) | ORAL | Status: DC
  Administered 2021-09-24 – 2021-09-30 (×13): 500 mg via ORAL
  Filled 2021-09-24 (×17): qty 1

## 2021-09-24 NOTE — Plan of Care (Signed)

## 2021-09-24 NOTE — Progress Notes (Signed)
Pt was seen for careful mobility on side of bed with body mechanics re-instructed for sidelying to sit and to stand up.  Sidestepping with RW was done, pt able to begin to move with dense postural cues.  TLSO has arrived for pt and will be ready for her to wear in support of the injuries she has sustained.  Follow along with her to increase control of balance and recover her gait independence, as well as continue to remind her of back precautions.    09/23/21 1140  PT Visit Information  Last PT Received On 09/23/21  Assistance Needed +1  History of Present Illness Pt is a 70 y.o. F who presents for evaluation of back pain. X-ray lumbar spine 09/02/2021 showing diffuse compression fractures from L2-5 that do not appear to be different from films obtained in 2021. Significant PMH: chronic back pain, anxiety, IBS.  Subjective Data  Subjective agrees to get OOB  Patient Stated Goal less pain/spasms  Precautions  Precautions Fall  Precaution Comments unsteady on her feet  Required Braces or Orthoses Spinal Brace  Spinal Brace TLSO;Other (comment) (apply in sitting, arrived after session ended)  Restrictions  Weight Bearing Restrictions No  Pain Assessment  Pain Assessment 0-10  Pain Score 5  Breathing 0  Pain Location low back mid back  Pain Descriptors / Indicators Grimacing;Guarding;Spasm  Pain Intervention(s) Limited activity within patient's tolerance;Monitored during session;Premedicated before session;Repositioned  Cognition  Arousal/Alertness Awake/alert  Behavior During Therapy WFL for tasks assessed/performed  Overall Cognitive Status Impaired/Different from baseline  Area of Impairment Problem solving;Following commands;Safety/judgement;Awareness  Following Commands Follows one step commands inconsistently;Follows one step commands with increased time  Safety/Judgement Decreased awareness of safety;Decreased awareness of deficits  Awareness Intellectual  Problem Solving Slow  processing;Requires verbal cues  Bed Mobility  Overal bed mobility Needs Assistance  Bed Mobility Rolling;Sidelying to Sit;Sit to Sidelying  Rolling Mod assist  Sidelying to sit Mod assist  Sit to sidelying Mod assist  General bed mobility comments mod assist with pt resisting direct instructions for body mechanics even with premobility description of the task  Transfers  Overall transfer level Needs assistance  Equipment used Rolling walker (2 wheels);1 person hand held assist  Transfers Sit to/from Stand  Sit to Stand Mod assist  General transfer comment mod to stand and sidestep  Ambulation/Gait  Ambulation/Gait assistance Min assist  Gait Distance (Feet) 10 Feet (sidestepping on bed)  Assistive device Rolling walker (2 wheels);1 person hand held assist  Gait Pattern/deviations Step-to pattern;Decreased stride length;Wide base of support  General Gait Details short nearly shuffled steps sideways to get up and down the bed  Gait velocity reduced  Gait velocity interpretation <1.31 ft/sec, indicative of household ambulator  Balance  Overall balance assessment Needs assistance  Sitting-balance support Bilateral upper extremity supported;Feet supported  Sitting balance-Leahy Scale Fair  Standing balance support Bilateral upper extremity supported;During functional activity  Standing balance-Leahy Scale Poor  General Comments  General comments (skin integrity, edema, etc.) pt was assisted to side of bed and reviewed her task, cues for hand placement and to sequence for standing  PT - End of Session  Equipment Utilized During Treatment Gait belt  Activity Tolerance Patient limited by pain  Patient left in bed;with bed alarm set (pt declines to sit in chair)  Nurse Communication Mobility status   PT - Assessment/Plan  PT Plan Current plan remains appropriate  PT Visit Diagnosis Other abnormalities of gait and mobility (R26.89);Pain  Pain - Right/Left  (back)  Pain - part of body   (back)  PT Frequency (ACUTE ONLY) Min 3X/week  Follow Up Recommendations Skilled nursing-short term rehab (<3 hours/day)  Assistance recommended at discharge Frequent or constant Supervision/Assistance  PT equipment Rolling walker (2 wheels)  AM-PAC PT "6 Clicks" Mobility Outcome Measure (Version 2)  Help needed turning from your back to your side while in a flat bed without using bedrails? 2  Help needed moving from lying on your back to sitting on the side of a flat bed without using bedrails? 2  Help needed moving to and from a bed to a chair (including a wheelchair)? 2  Help needed standing up from a chair using your arms (e.g., wheelchair or bedside chair)? 2  Help needed to walk in hospital room? 2  Help needed climbing 3-5 steps with a railing?  1  6 Click Score 11  Consider Recommendation of Discharge To: CIR/SNF/LTACH  Progressive Mobility  What is the highest level of mobility based on the progressive mobility assessment? Level 4 (Walks with assist in room) - Balance while marching in place and cannot step forward and back - Complete  Mobility Ambulated with assistance in room  PT Goal Progression  Progress towards PT goals Progressing toward goals  PT Time Calculation  PT Start Time (ACUTE ONLY) 1035  PT Stop Time (ACUTE ONLY) 1109  PT Time Calculation (min) (ACUTE ONLY) 34 min  PT General Charges  $$ ACUTE PT VISIT 1 Visit  PT Treatments  $Gait Training 8-22 mins  $Therapeutic Activity 8-22 mins    Mee Hives, PT PhD Acute Rehab Dept. Number: [place] [facility] [place]

## 2021-09-24 NOTE — Progress Notes (Signed)
Physical Therapy Treatment Patient Details Name: Alisha Henderson MRN: [id number] DOB: 12/27/1950 Today's Date: 09/24/2021   History of Present Illness Pt is a 70 y.o. F who presents on 09/20/21 for evaluation of back pain. X-ray lumbar spine 09/06/2021 showing diffuse compression fractures from L2-5 that do not appear to be different from films obtained in 2021. Significant PMH: chronic back pain, anxiety, IBS.    PT Comments    Pt was able to get up OOB to chair with min assist overall today.  Brace is ill fitting, but it is her shape and not the brace that makes it that way.  Adjustment in standing helped, but once sitting due to short trunk, pear shape and kyphotic posture the chest plate (at it's lowest setting) still rides up to her neck.  PT will continue to follow acutely for safe mobility progression.  Pt would prefer to be seen in the AM (9 o'clock hour) if possible in future sessions.   Recommendations for follow up therapy are one component of a multi-disciplinary discharge planning process, led by the attending physician.  Recommendations may be updated based on patient status, additional functional criteria and insurance authorization.  Follow Up Recommendations  Skilled nursing-short term rehab (<3 hours/day)     Assistance Recommended at Discharge Frequent or constant Supervision/Assistance (assist for all mobility)  Equipment Recommendations  Rolling walker (2 wheels)    Recommendations for Other Services       Precautions / Restrictions Precautions Precautions: Fall Precaution Comments: unsteady on her feet, blind in L eye Required Braces or Orthoses: Spinal Brace Spinal Brace: Thoracolumbosacral orthotic;Other (comment) ("when OOB" I donned in sitting and adjusted in standing today.)     Mobility  Bed Mobility Overal bed mobility: Needs Assistance Bed Mobility: Rolling;Sidelying to Sit Rolling: Min assist Sidelying to sit: Min assist       General bed  mobility comments: Min assist to roll with cues for sequencing/hand placement log roll to avoid twisting, heavier min assist to support trunk to come up to sitting EOB.    Transfers Overall transfer level: Needs assistance Equipment used: Rolling walker (2 wheels);1 person hand held assist Transfers: Sit to/from [facility] Sit to Stand: Min assist Stand pivot transfers: Min assist         General transfer comment: Min assist to stand and pivot to the chair after marching in place and standing for ~3 mins to allow me to adjust her TLSO down (which I could not do in sitting as she is so short waisted)    Ambulation/Gait                 Stairs             Wheelchair Mobility    Modified Rankin (Stroke Patients Only)       Balance Overall balance assessment: Needs assistance Sitting-balance support: Feet supported;Bilateral upper extremity supported Sitting balance-Leahy Scale: Poor Sitting balance - Comments: ever progressing slow posterior lean Postural control: Posterior lean Standing balance support: Bilateral upper extremity supported;During functional activity Standing balance-Leahy Scale: Poor Standing balance comment: needs support from RW and therapist in standing.                            Cognition Arousal/Alertness: Awake/alert Behavior During Therapy: Anxious (emotially labile)  General Comments: Weaping throughout session, reporting she did not want to die, anxious about blood clots, yet refusing a blood thinner.        Exercises General Exercises - Lower Extremity Ankle Circles/Pumps: AROM;Both;20 reps Long Arc Quad: AROM;Both;Seated    General Comments        Pertinent Vitals/Pain Pain Assessment: Faces Faces Pain Scale: Hurts even more Pain Location: low back mid back Pain Descriptors / Indicators: Grimacing;Guarding;Spasm Pain Intervention(s): Limited  activity within patient's tolerance;Monitored during session;Repositioned    Home Living                          Prior Function            PT Goals (current goals can now be found in the care plan section) Acute Rehab PT Goals Patient Stated Goal: less pain/spasms Progress towards PT goals: Progressing toward goals    Frequency    Min 3X/week      PT Plan Current plan remains appropriate    Co-evaluation              AM-PAC PT "6 Clicks" Mobility   Outcome Measure  Help needed turning from your back to your side while in a flat bed without using bedrails?: A Little Help needed moving from lying on your back to sitting on the side of a flat bed without using bedrails?: A Little Help needed moving to and from a bed to a chair (including a wheelchair)?: A Little Help needed standing up from a chair using your arms (e.g., wheelchair or bedside chair)?: A Little Help needed to walk in hospital room?: A Lot Help needed climbing 3-5 steps with a railing? : Total 6 Click Score: 15    End of Session Equipment Utilized During Treatment: Back brace Activity Tolerance: Patient limited by pain Patient left: in chair;with call bell/phone within reach Nurse Communication: Mobility status PT Visit Diagnosis: Other abnormalities of gait and mobility (R26.89);Pain Pain - Right/Left:  (back) Pain - part of body:  (back)     Time: 1538-1610 PT Time Calculation (min) (ACUTE ONLY): 32 min  Charges:  $Therapeutic Activity: 8-22 mins                     Verdene Lennert, PT, DPT  Acute Rehabilitation Ortho Tech Supervisor [phone] pager [phone]) [phone] office

## 2021-09-24 NOTE — Progress Notes (Signed)
PROGRESS NOTE   Alisha Henderson  HAL:[id number] DOB: [birth year] DOA: 09/20/2021 PCP: Shirline Frees, MD  Brief Narrative:  70 year old female Known history low back pain L2-L5 compression fractures seen 09/02/2021-declined vertebroplasty previously Dr. Durward Fortes. Admitted 10/23 for repeat muscle spasms in legs and radicular pain nonresponsive to Robaxin Orthopedics Dr. Durward Fortes consulted MRI back showed T10, T11 compression fracture--neurosurgery Dr. Trenton Gammon consulted Recommendations for TLSO bracing and trial of nonoperative measures   Hospital-Problem based course  T10-11 acute compression fractures Main issue is spasm--we have kept her on Robaxin but will change it to 4 times daily 500 and increase if this is worth She will need probable skilled placement and therapies.  Work with her later today Likely hypovolemic hyponatremia osmolality 269 Patient declines IV fluid as she was passing a lot of urine with this I have explained to her she would need replacement of her sodium and potassium and will start salt tablets 2 g twice daily with meals Able to fluid restricted her temporarily to 1200 cc Recheck labs in a.m. 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  Pain is 7 on 10  Objective: Vitals:   09/23/21 0637 09/23/21 0821 09/23/21 1534 09/23/21 2100  BP: 95/67 (!) 150/93 (!) 96/35 (!) 109/49  Pulse: 88 91 81 94  Resp: 20 20 17 14   Temp:  98.7 F (37.1 C) 97.8 F (36.6 C) 98 F (36.7 C) 97.8 F (36.6 C)  TempSrc: Oral Oral Oral   SpO2: 100% 100% 99% 99%    Intake/Output Summary (Last 24 hours) at 09/24/2021 0700 Last data filed at 09/23/2021 2100 Gross per 24 hour  Intake 120 ml  Output --  Net 120 ml   There were no vitals filed for this visit.  Examination:  Frail white female in no distress no icterus no pallor Chest clear no added sound no rales rhonchi S1-S2 no murmur no rub no gallop Abdomen soft ROM intact no focal deficit  Data Reviewed: personally reviewed   CBC    Component Value Date/Time   WBC 5.6 09/22/2021 0953   RBC 5.37 (H) 09/22/2021 0953   HGB 14.1 09/22/2021 0953   HCT 42.2 09/22/2021 0953   PLT 274 09/22/2021 0953   MCV 78.6 (L) 09/22/2021 0953   MCH 26.3 09/22/2021 0953   MCHC 33.4 09/22/2021 0953   RDW 14.6 09/22/2021 0953   LYMPHSABS 0.7 09/21/2021 0620   MONOABS 0.2 09/21/2021 0620   EOSABS 0.0 09/21/2021 0620   BASOSABS 0.0 09/21/2021 0620   CMP Latest Ref Rng & Units 09/24/2021 09/23/2021 09/22/2021  Glucose 70 - 99 mg/dL 88 86 95  BUN 8 - 23 mg/dL 9 8 6(L)  Creatinine 0.44 - 1.00 mg/dL 0.62 0.67 0.69  Sodium 135 - 145 mmol/L 130(L) 128(L) 131(L)  Potassium 3.5 - 5.1 mmol/L 2.9(L) 3.1(L) 3.5  Chloride 98 - 111 mmol/L 99 98 98  CO2 22 - 32 mmol/L 24 19(L) 20(L)  Calcium 8.9 - 10.3 mg/dL 8.3(L) 8.6(L) 8.7(L)     Radiology  Studies: MR LUMBAR SPINE WO CONTRAST  Result Date: 09/22/2021 CLINICAL DATA:  Compression fracture EXAM: MRI LUMBAR SPINE WITHOUT CONTRAST TECHNIQUE: Multiplanar, multisequence MR imaging of the lumbar spine was performed. No intravenous contrast was administered. COMPARISON:  Lumbar spine MRI 11/04/2020 FINDINGS: Segmentation:  Standard; the lowest disc space is designated L5-S1 Alignment: There is mild straightening of the normal lumbar lordosis. There is no antero or retrolisthesis. Vertebrae: There is an acute fracture of the T10 vertebral body  with mild loss of vertebral body height centrally. There is no significant bony retropulsion. There is an additional acute fracture of the T11 vertebral body with up to approximately 50% loss of vertebral body height centrally. There is moderate bony retropulsion at this level with mild mass effect on the cord. Additional compression deformities of the L2 through L5 vertebral bodies are not significantly changed compared to the prior MRI from 11/04/2020, most severe at L5. There is mild bony retropulsion at L2 and L5 without significant spinal canal stenosis or mass effect on the cauda equina nerve roots. Conus medullaris and cauda equina: Conus extends to the L1-L2 level. Conus and cauda equina appear normal. Paraspinal and other soft tissues: The paraspinal soft tissues are unremarkable. Disc levels: T10-T11: As above, there is moderate bony retropulsion of the T11 vertebral body resulting in mild-to-moderate spinal canal stenosis with the formation of the cord. There is no cord signal abnormality. T11-T12: No significant spinal canal or neural foraminal stenosis. T12-L1: No significant spinal canal or neural foraminal stenosis. L1-L2: There is mild bony retropulsion of the posterosuperior L2 endplate resulting in minimal spinal canal stenosis without significant neural foraminal stenosis. L2-L3: There is mild degenerative endplate change and bilateral facet arthropathy without significant spinal canal or neural foraminal stenosis. L3-L4: There is mild degenerative endplate change and bilateral facet arthropathy without significant spinal canal or neural foraminal stenosis. L4-L5: There is mild bony retropulsion of the posterosuperior L5 endplate with superimposed degenerative endplate change and facet arthropathy, without significant spinal canal or neural foraminal stenosis. L5-S1: There is a mild central disc protrusion, degenerative endplate change, and bilateral facet arthropathy without significant spinal  canal or neural foraminal stenosis. IMPRESSION: 1. Acute fracture of the T10 vertebral body with mild loss of vertebral body height centrally but no bony retropulsion. 2. Acute compression fracture of the T11 vertebral body with up to approximately 50% loss of vertebral body height centrally and moderate bony retropulsion. There is resultant mild-to-moderate spinal canal stenosis with deformation of the cord but no cord signal abnormality. 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 Follow up postcontrast lumbar spine MRI in 2-3 months may be considered to exclude underlying lesion. 3. Other compression fractures at the L2 through L5 vertebral bodies are not significantly changed compared to the MRI from 11/05/2019. 4. Otherwise, multilevel degenerative changes throughout the lumbar spine are detailed above without other high-grade spinal canal or neural foraminal stenosis. Electronically Signed   By: Valetta Mole M.D.   On: 09/22/2021 14:34     Scheduled Meds:  docusate sodium  100 mg Oral BID   LORazepam  1 mg Oral Q8H   multivitamin with minerals  1 tablet Oral Daily   propranolol  10 mg Oral BID   propranolol  5 mg Oral QHS   Continuous Infusions:  lactated ringers 100 mL/hr at 09/23/21 1506   methocarbamol (ROBAXIN) IV Stopped (09/22/21 0152)     LOS: 1 day   Time spent: [place], MD [facility] To  contact the attending provider between 7A-7P or the covering provider during after hours 7P-7A, please log into the web site [email or web address] and access using universal [place] password for that web site. If you do not have the password, please call the hospital operator.  09/24/2021, 7:00 AM

## 2021-09-24 NOTE — Progress Notes (Signed)
Subjective:comfortable at rest,has TLSO brace  Objective: Vital signs in last 24 hours: Temp:  [97.8 F (36.6 C)-98 F (36.7 C)] 97.8 F (36.6 C) (10/25 2100) Pulse Rate:  [81-94] 94 (10/25 2100) Resp:  [14-20] 14 (10/25 2100) BP: (96-150)/(35-93) 109/49 (10/25 2100) SpO2:  [99 %-100 %] 99 % (10/25 2100)  Intake/Output from previous day: 10/25 0701 - 10/26 0700 In: 120 [P.O.:120] Out: -  Intake/Output this shift: No intake/output data recorded.  Recent Labs    09/22/21 0953  HGB 14.1   Recent Labs    09/22/21 0953  WBC 5.6  RBC 5.37*  HCT 42.2  PLT 274   Recent Labs    09/23/21 0202 09/24/21 0238  NA 128* 130*  K 3.1* 2.9*  CL 98 99  CO2 19* 24  BUN 8 9  CREATININE 0.67 0.62  GLUCOSE 86 88  CALCIUM 8.6* 8.3*   No results for input(s): LABPT, INR in the last 72 hours.  New compression fxs atT10 and 11-evaluated by neurosurgery with recommendation of PT and brace. Possible kyphoplasty/vertebroplasty if no improvement over 4-6 weeks    Assessment/Plan: Will see in office as needed-will f/u with neurosurgery     Garald Balding 09/24/2021, 7:43 AM

## 2021-09-25 ENCOUNTER — Telehealth: Payer: Self-pay | Admitting: Orthopaedic Surgery

## 2021-09-25 DIAGNOSIS — M549 Dorsalgia, unspecified: Secondary | ICD-10-CM | POA: Diagnosis not present

## 2021-09-25 DIAGNOSIS — I10 Essential (primary) hypertension: Secondary | ICD-10-CM | POA: Diagnosis not present

## 2021-09-25 DIAGNOSIS — X58XXXA Exposure to other specified factors, initial encounter: Secondary | ICD-10-CM | POA: Diagnosis not present

## 2021-09-25 DIAGNOSIS — Z20822 Contact with and (suspected) exposure to covid-19: Secondary | ICD-10-CM | POA: Diagnosis not present

## 2021-09-25 DIAGNOSIS — S3992XA Unspecified injury of lower back, initial encounter: Secondary | ICD-10-CM | POA: Diagnosis not present

## 2021-09-25 DIAGNOSIS — S22088A Other fracture of T11-T12 vertebra, initial encounter for closed fracture: Secondary | ICD-10-CM | POA: Diagnosis not present

## 2021-09-25 DIAGNOSIS — Z79899 Other long term (current) drug therapy: Secondary | ICD-10-CM | POA: Diagnosis not present

## 2021-09-25 LAB — COMPREHENSIVE METABOLIC PANEL
ALT: 10 U/L (ref 0–44)
AST: 19 U/L (ref 15–41)
Albumin: 2.4 g/dL — ABNORMAL LOW (ref 3.5–5.0)
Alkaline Phosphatase: 79 U/L (ref 38–126)
Anion gap: 6 (ref 5–15)
BUN: 6 mg/dL — ABNORMAL LOW (ref 8–23)
CO2: 24 mmol/L (ref 22–32)
Calcium: 8.6 mg/dL — ABNORMAL LOW (ref 8.9–10.3)
Chloride: 100 mmol/L (ref 98–111)
Creatinine, Ser: 0.6 mg/dL (ref 0.44–1.00)
GFR, Estimated: >60 mL/min (ref >60)
Glucose, Bld: 101 mg/dL — ABNORMAL HIGH (ref 70–99)
Potassium: 3.9 mmol/L (ref 3.5–5.1)
Sodium: 130 mmol/L — ABNORMAL LOW (ref 135–145)
Total Bilirubin: 1.2 mg/dL (ref 0.3–1.2)
Total Protein: 5.2 g/dL — ABNORMAL LOW (ref 6.5–8.1)

## 2021-09-25 LAB — CBC WITH DIFFERENTIAL/PLATELET
Abs Immature Granulocytes: 0.01 10*3/uL (ref 0.00–0.07)
Basophils Absolute: 0 10*3/uL (ref 0.0–0.1)
Basophils Relative: 1 %
Eosinophils Absolute: 0.2 10*3/uL (ref 0.0–0.5)
Eosinophils Relative: 5 %
HCT: 36.7 % (ref 36.0–46.0)
Hemoglobin: 12.4 g/dL (ref 12.0–15.0)
Immature Granulocytes: 0 %
Lymphocytes Relative: 36 %
Lymphs Abs: 1.5 10*3/uL (ref 0.7–4.0)
MCH: 26.4 pg (ref 26.0–34.0)
MCHC: 33.8 g/dL (ref 30.0–36.0)
MCV: 78.1 fL — ABNORMAL LOW (ref 80.0–100.0)
Monocytes Absolute: 0.3 10*3/uL (ref 0.1–1.0)
Monocytes Relative: 7 %
Neutro Abs: 2.1 10*3/uL (ref 1.7–7.7)
Neutrophils Relative %: 51 %
Platelets: 208 10*3/uL (ref 150–400)
RBC: 4.7 MIL/uL (ref 3.87–5.11)
RDW: 14.6 % (ref 11.5–15.5)
WBC: 4.2 10*3/uL (ref 4.0–10.5)
nRBC: 0 % (ref 0.0–0.2)

## 2021-09-25 LAB — SARS CORONAVIRUS 2 (TAT 6-24 HRS): SARS Coronavirus 2: NEGATIVE

## 2021-09-25 NOTE — Telephone Encounter (Signed)
Patient's daughter called. She would like Dr. Durward Fortes to call her concerning mom's care. Her number is [phone]

## 2021-09-25 NOTE — TOC Initial Note (Signed)
Transition of Care [facility]) - Initial/Assessment Note    Patient Details  Name: Alisha Henderson MRN: [id number] Date of Birth: 04-Jan-1951  Transition of Care [facility]) CM/SW Contact:    Milinda Antis, [place] Phone Number: 09/25/2021, 3:57 PM  Clinical Narrative:                 CSW received consult for possible SNF placement at time of discharge. CSW spoke with patient. Patient reported that patient is from home alone. Patient expressed understanding of PT recommendation and is agreeable to SNF placement at time of discharge. Patient reports preference for a facility in [place] . CSW discussed insurance authorization process and will provide Medicare SNF ratings list. Patient has received 0 COVID vaccines. No further questions reported at this time.   Skilled Nursing Rehab Facilities-   [email or web address]   Ratings out of 5 possible   Name Address  Phone # [place] Inspection Overall  [facility] [address] [place] 5 5 2 4   [facility]  [place], [place] [phone] 4 2 5 5   [facility] [place], [place] 4 1 1 1   [facility] [place], [place] 2 2 4 4   [facility] [address] [place] 2 1 1 1   [facility] [place] [place] 3 1 4 3   [facility] [address] [place] 5 2 2 3   [facility] [address] [place] 4 1 2 1   [facility] [place], [place] [phone] 5 1 2 2   [facility] [phone], [place] [phone] 4 1 1 1   [facility] [address], [facility] [phone] 4 1 2 1   [facility] [address] [place] 4 1 1 1         Barriers to Discharge: Continued Medical Work up   Patient Goals and CMS Choice         Expected Discharge Plan and Services     Discharge Planning Services: CM Consult   Living arrangements for the past 2 months: [place]                 DME Arranged: Gilford Rile, 3-N-1 DME Agency: [employer] Date DME Agency Contacted: 09/21/21 Time DME Agency Contacted: 9   HH Arranged: OT, PT, Nurse's Aide, Social Work [employer] Agency: [place] Date [place]: 09/21/21 Time [place]: 1115 Representative spoke with at [place]: [place] Arrangements/Services Living arrangements for the past 2 months: [place] with:: Self Patient language and need for interpreter reviewed:: Yes        Need for Family Participation in Patient Care: Yes (Comment) Care giver support system in place?: Yes (comment)   Criminal Activity/Legal Involvement Pertinent to Current Situation/Hospitalization: No - Comment as needed  Activities of Daily Living      Permission Sought/Granted                  Emotional Assessment   Attitude/Demeanor/Rapport: Lethargic Affect (typically observed): Accepting Orientation: : Oriented to Self, Oriented to Place, Oriented to  Time, Oriented to Situation Alcohol / Substance Use: Not Applicable Psych Involvement: No (comment)  Admission diagnosis:  Pain [R52] Muscle spasm of back [M62.830] Intractable back pain [M54.9] Chronic low back pain, unspecified back pain laterality, unspecified whether sciatica present [M54.50, G89.29] Patient  Active Problem List   Diagnosis Date Noted   Protein-calorie malnutrition, severe 09/22/2021   Intractable back pain 09/21/2021   Blindness of left eye 09/21/2021   Low back pain 09/24/2020   Neck pain, acute 09/24/2020   Endophthalmitis, acute, left 01/12/2020   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   Anxiety 01/12/2020   IBS (irritable bowel syndrome) 01/12/2020   Mitral valve prolapse 01/12/2020   S/P cataract extraction and insertion  of intraocular lens, left 01/12/2020   Multilevel degenerative disc disease 01/12/2020   PCP:  Shirline Frees, MD Pharmacy:   [place], [place] [address] Phone: [phone] Fax: [place], [place] [address] Phone: [phone] Fax: [phone]     Social Determinants of Health (SDOH) Interventions    Readmission Risk Interventions No flowsheet data found.

## 2021-09-25 NOTE — Progress Notes (Signed)
PROGRESS NOTE   Alisha Henderson  JJO:[id number] DOB: May 17, 1951 DOA: 09/20/2021 PCP: Shirline Frees, MD  Brief Narrative:  70 year old female Known history low back pain L2-L5 compression fractures seen 09/02/2021-declined vertebroplasty previously Dr. Durward Fortes. Admitted 10/23 for repeat muscle spasms in legs and radicular pain nonresponsive to Robaxin Orthopedics Dr. Durward Fortes consulted MRI back showed T10, T11 compression fracture--neurosurgery Dr. Trenton Gammon consulted Recommendations for TLSO bracing and trial of nonoperative measures Hospitalization complicated by patient noncompliance in addition to electrolyte disturbances  Hospital-Problem based course  T10-11 acute compression fractures For spasm continue Robaxin but will change it to 4 times daily 500 and changed to p.o. 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  DVT prophylaxis: Lovenox Code Status: Full Family Communication: None Disposition:  Status is: Observation  The patient will require care spanning > 2 midnights and should be moved to inpatient because: Hyponatremia and is refusing IV so will need at least 24 hours to correct      Consultants:    Procedures:   Antimicrobials:    Subjective:  She still has many questions about why she cannot stay here indefinitely for rehab and I explained to her the venue for rehab and for further management of her back pain is best suited to be a skilled facility-she is interested in knowing what  her options are No chest pain no fever Pain is about 5 on 10 and it is more spasm than pain  Objective: Vitals:   09/24/21 1200 09/24/21 1357 09/24/21 2013 09/25/21 0410  BP:  (!) 135/52 (!) 104/33 (!) 151/57  Pulse:  96 100 87  Resp:  17 16 17   Temp:  98.1 F (36.7 C) 98.2 F (36.8 C) 98.2 F (36.8 C)  TempSrc:  Oral Oral Oral  SpO2:  100% 98% 100%  Weight: 46.7 kg     Height: 5' 5.98" (1.676 m)       Intake/Output Summary (Last 24 hours) at 09/25/2021 0719 Last data filed at 09/25/2021 0500 Gross per 24 hour  Intake --  Output 950 ml  Net -950 ml    Filed Weights   09/24/21 1200  Weight: 46.7 kg    Examination:  Alert coherent no distress extraocular movements intact S1-S2 no murmur no rub no gallop Straight leg raise test positive on the right side with pain on the left side she has less pain Abdomen soft nontender no rebound no guarding Chest clear no rales rhonchi Neurologically intact dorsi plantarflexion are intact Affect is flat-patient seems agitated at times  Data Reviewed: personally reviewed   CBC    Component Value Date/Time   WBC 4.2 09/25/2021 0252   RBC 4.70 09/25/2021 0252   HGB 12.4 09/25/2021 0252   HCT 36.7 09/25/2021 0252   PLT 208 09/25/2021 0252   MCV 78.1 (L) 09/25/2021 0252   MCH 26.4 09/25/2021 0252   MCHC 33.8 09/25/2021 0252   RDW 14.6 09/25/2021 0252   LYMPHSABS 1.5 09/25/2021 0252   MONOABS 0.3 09/25/2021 0252   EOSABS 0.2 09/25/2021 0252   BASOSABS 0.0 09/25/2021 0252   CMP Latest Ref Rng &  Units 09/25/2021 09/24/2021 09/23/2021  Glucose 70 - 99 mg/dL 101(H) 88 86  BUN 8 - 23 mg/dL 6(L) 9 8  Creatinine 0.44 - 1.00 mg/dL 0.60 0.62 0.67  Sodium 135 - 145 mmol/L 130(L) 130(L) 128(L)  Potassium 3.5 - 5.1 mmol/L 3.9 2.9(L) 3.1(L)  Chloride 98 - 111 mmol/L 100 99 98  CO2 22 - 32 mmol/L 24 24 19(L)  Calcium 8.9 - 10.3 mg/dL 8.6(L) 8.3(L) 8.6(L)  Total Protein 6.5 - 8.1 g/dL 5.2(L) - -  Total Bilirubin 0.3 - 1.2 mg/dL 1.2  - -  Alkaline Phos 38 - 126 U/L 79 - -  AST 15 - 41 U/L 19 - -  ALT 0 - 44 U/L 10 - -     Radiology Studies: No results found.   Scheduled Meds:  docusate sodium  100 mg Oral BID   enoxaparin (LOVENOX) injection  30 mg Subcutaneous Daily   LORazepam  1 mg Oral Q8H   methocarbamol  500 mg Oral QID   multivitamin with minerals  1 tablet Oral Daily   polyvinyl alcohol  1 drop Both Eyes 5 X Daily   potassium chloride  40 mEq Oral BID   propranolol  10 mg Oral BID   propranolol  5 mg Oral QHS   sodium chloride  2 g Oral BID WC   Continuous Infusions:     LOS: 1 day   Time spent: 52  Nita Sells, MD [facility] To contact the attending provider between 7A-7P or the covering provider during after hours 7P-7A, please log into the web site [email or web address] and access using universal [place] password for that web site. If you do not have the password, please call the hospital operator.  09/25/2021, 7:19 AM

## 2021-09-25 NOTE — [place] FL2 (Signed)
[place] MEDICAID FL2 LEVEL OF CARE SCREENING TOOL     IDENTIFICATION  Patient Name: Alisha Henderson Birthdate: 1951/06/12 Sex: female Admission Date (Current Location): 09/20/2021  [facility] and [place] Number:  Herbalist and Address:  [facility], [place] [address], [place], [place] [address]      Provider Number: [id number]  Attending Physician Name and Address:  Nita Sells, MD  Relative Name and Phone Number:  Melburn Popper   [phone]    Current Level of Care: Hospital Recommended Level of Care: [place] Prior Approval Number:    Date Approved/Denied:   PASRR Number: [id number]  Discharge Plan: SNF    Current Diagnoses: Patient Active Problem List   Diagnosis Date Noted   Protein-calorie malnutrition, severe 09/22/2021   Intractable back pain 09/21/2021   Blindness of left eye 09/21/2021   Low back pain 09/24/2020   Neck pain, acute 09/24/2020   Endophthalmitis, acute, left 01/12/2020   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   Anxiety 01/12/2020   IBS (irritable bowel syndrome) 01/12/2020   Mitral valve prolapse 01/12/2020   S/P cataract extraction and insertion of intraocular lens, left 01/12/2020   Multilevel degenerative disc disease 01/12/2020    Orientation RESPIRATION BLADDER Height & Weight     Self, Time, Situation, Place  Normal Incontinent Weight: 103 lb (46.7 kg) Height:  5' 5.98" (167.6 cm)  BEHAVIORAL SYMPTOMS/MOOD NEUROLOGICAL BOWEL NUTRITION STATUS      Continent Diet (see d/c summary)  AMBULATORY STATUS COMMUNICATION OF NEEDS Skin   Limited Assist Verbally Normal                       Personal Care Assistance Level of Assistance  Bathing, Feeding, Dressing Bathing Assistance: Limited assistance Feeding assistance: Independent Dressing Assistance: Limited assistance     Functional Limitations Info  Sight, Hearing, Speech Sight Info: Impaired (Blind left  eye) Hearing Info: Adequate Speech Info: Adequate    SPECIAL CARE FACTORS FREQUENCY  PT (By licensed PT), OT (By licensed OT)     PT Frequency: 5x/ week OT Frequency: 5x/ week            Contractures Contractures Info: Not present    Additional Factors Info  Code Status, Allergies Code Status Info: Full Allergies Info: Cephalosporins   Sulfa Antibiotics   Sulfites   Cephalexin   Ciprofloxacin   Codeine   Other   Penicillins           Current Medications (09/25/2021):  This is the current hospital active medication list Current Facility-Administered Medications  Medication Dose Route Frequency Provider Last Rate Last Admin   acetaminophen (TYLENOL) tablet 650 mg  650 mg Oral Q6H PRN Karmen Bongo, MD       Or   acetaminophen (TYLENOL) suppository 650 mg  650 mg Rectal Q6H PRN Karmen Bongo, MD       bisacodyl (DULCOLAX) EC tablet 5 mg  5 mg Oral Daily PRN Karmen Bongo, MD       docusate sodium (COLACE) capsule 100 mg  100 mg Oral BID Karmen Bongo, MD   100 mg at 09/22/21 2126   enoxaparin (LOVENOX) injection 30 mg  30 mg Subcutaneous Daily Nita Sells, MD       LORazepam (ATIVAN) tablet 1 mg  1 mg Oral Q8H Kc, Ramesh, MD   1 mg at 09/23/21 1429   methocarbamol (ROBAXIN) tablet 500 mg  500 mg Oral QID Nita Sells, MD   500  mg at 09/25/21 1020   morphine 2 MG/ML injection 2 mg  2 mg Intravenous Q2H PRN Karmen Bongo, MD       multivitamin with minerals tablet 1 tablet  1 tablet Oral Daily Kc, Ramesh, MD   1 tablet at 09/25/21 1020   ondansetron (ZOFRAN) tablet 4 mg  4 mg Oral Q6H PRN Karmen Bongo, MD       Or   ondansetron [facility]) injection 4 mg  4 mg Intravenous Q6H PRN Karmen Bongo, MD       oxyCODONE (Oxy IR/ROXICODONE) immediate release tablet 5 mg  5 mg Oral Q4H PRN Karmen Bongo, MD       polyethylene glycol (MIRALAX / GLYCOLAX) packet 17 g  17 g Oral Daily PRN Karmen Bongo, MD       polyvinyl alcohol (LIQUIFILM TEARS) 1.4 %  ophthalmic solution 1 drop  1 drop Both Eyes PRN Heloise Purpura, RPH       polyvinyl alcohol (LIQUIFILM TEARS) 1.4 % ophthalmic solution 1 drop  1 drop Both Eyes 5 X Daily Samtani, Jai-Gurmukh, MD   1 drop at 09/25/21 1531   propranolol (INDERAL) tablet 10 mg  10 mg Oral BID Heloise Purpura, RPH   10 mg at 09/25/21 1020   propranolol (INDERAL) tablet 5 mg  5 mg Oral QHS Heloise Purpura, RPH   5 mg at 09/22/21 2126   sodium chloride tablet 2 g  2 g Oral BID WC Nita Sells, MD   2 g at 09/24/21 1622   traMADol (ULTRAM) tablet 50 mg  50 mg Oral Q6H PRN Antonieta Pert, MD   50 mg at 09/22/21 1003     Discharge Medications: Please see discharge summary for a list of discharge medications.  Relevant Imaging Results:  Relevant Lab Results:   Additional Information SSN: [id number]; patient has not received any COVID vaccines; 5'5" 103lbs  Evander Macaraeg F Amaiya Scruton, LCSWA

## 2021-09-25 NOTE — Progress Notes (Signed)
Occupational Therapy Treatment Patient Details Name: Alisha Henderson MRN: [id number] DOB: 1951-02-12 Today's Date: 09/25/2021   History of present illness Pt is a 70 y.o. F who presents on 09/20/21 for evaluation of back pain. MRI showed acute compression fx's of T10 and T11 as well as chronic compression fx's of L2-5. Significant PMH: chronic back pain, anxiety, IBS.   OT comments  Patient received in bed and stated she continues to have back pain but was willing to participate with OT treatment. Patient performed grooming seated in recliner due to patient did not want to attempt standing at sink at this time. Patient performed transfer to eob from recliner with min assist to stand and patient stating she felt like she was falling. Patient required increased time for bed mobility and positioning. Acute OT to continue to follow.    Recommendations for follow up therapy are one component of a multi-disciplinary discharge planning process, led by the attending physician.  Recommendations may be updated based on patient status, additional functional criteria and insurance authorization.    Follow Up Recommendations  Skilled nursing-short term rehab (<3 hours/day)    Assistance Recommended at Discharge Frequent or constant Supervision/Assistance  Equipment Recommendations  Other (comment)    Recommendations for Other Services      Precautions / Restrictions Precautions Precautions: Fall Precaution Comments: unsteady on her feet, blind in L eye Required Braces or Orthoses: Spinal Brace Spinal Brace: Thoracolumbosacral orthotic;Applied in sitting position Spinal Brace Comments: Due to pt's body habitus chest plate up into throat when in sitting. Removed chest plate in sitting.       Mobility Bed Mobility Overal bed mobility: Needs Assistance Bed Mobility: Sit to Supine     Supine to sit: Min assist;HOB elevated Sit to supine: Min assist   General bed mobility comments: required  assistance getting feet onto bed and positioning in bed    Transfers Overall transfer level: Needs assistance Equipment used: Rolling walker (2 wheels) Transfers: Sit to/from Stand Sit to Stand: Min assist Stand pivot transfers: Min assist         General transfer comment: fearful once up  of falling     Balance Overall balance assessment: Needs assistance Sitting-balance support: Feet supported;Bilateral upper extremity supported Sitting balance-Leahy Scale: Poor Sitting balance - Comments: sitting in recliner Postural control: Left lateral lean Standing balance support: Bilateral upper extremity supported;During functional activity Standing balance-Leahy Scale: Poor Standing balance comment: used rolling walker for support, stated she felt like she was falling                           ADL either performed or assessed with clinical judgement   ADL Overall ADL's : Needs assistance/impaired Eating/Feeding: Set up;Bed level   Grooming: Set up;Sitting Grooming Details (indicate cue type and reason): performed seated in recliner                               General ADL Comments: patient declined performing grooming standing at sink due to pain and fatigue     Vision       Perception     Praxis      Cognition Arousal/Alertness: Awake/alert Behavior During Therapy: Anxious Overall Cognitive Status: Within Functional Limits for tasks assessed Area of Impairment: Problem solving;Following commands;Safety/judgement;Awareness  Following Commands: Follows one step commands inconsistently;Follows one step commands with increased time Safety/Judgement: Decreased awareness of safety;Decreased awareness of deficits Awareness: Intellectual Problem Solving: Slow processing;Requires verbal cues General Comments: worried about her healing and decrease pain          Exercises     Shoulder Instructions        General Comments      Pertinent Vitals/ Pain       Pain Assessment: Faces Faces Pain Scale: Hurts even more Breathing: normal Negative Vocalization: none Facial Expression: sad, frightened, frown Body Language: relaxed Consolability: distracted or reassured by voice/touch PAINAD Score: 2 Pain Location: low back mid back and rt hip Pain Descriptors / Indicators: Grimacing;Guarding;Spasm Pain Intervention(s): Limited activity within patient's tolerance  Home Living                                          Prior Functioning/Environment              Frequency  Min 2X/week        Progress Toward Goals  OT Goals(current goals can now be found in the care plan section)  Progress towards OT goals: Progressing toward goals  Acute Rehab OT Goals Patient Stated Goal: to get better OT Goal Formulation: With patient Time For Goal Achievement: 10/07/21 Potential to Achieve Goals: Good ADL Goals Pt Will Perform Grooming: with modified independence;sitting Pt Will Perform Lower Body Bathing: with modified independence Pt Will Perform Lower Body Dressing: with modified independence;sit to/from stand Pt Will Transfer to Toilet: with modified independence;stand pivot transfer;bedside commode Pt Will Perform Toileting - Clothing Manipulation and hygiene: with modified independence  Plan Discharge plan remains appropriate    Co-evaluation                 AM-PAC OT "6 Clicks" Daily Activity     Outcome Measure   Help from another person eating meals?: A Little Help from another person taking care of personal grooming?: A Little Help from another person toileting, which includes using toliet, bedpan, or urinal?: Total Help from another person bathing (including washing, rinsing, drying)?: A Lot Help from another person to put on and taking off regular upper body clothing?: A Lot Help from another person to put on and taking off regular lower body  clothing?: A Lot 6 Click Score: 13    End of Session Equipment Utilized During Treatment: Rolling walker (2 wheels)  OT Visit Diagnosis: Unsteadiness on feet (R26.81);Other abnormalities of gait and mobility (R26.89);Muscle weakness (generalized) (M62.81);Other symptoms and signs involving cognitive function;Pain   Activity Tolerance Patient limited by pain   Patient Left in bed;with call bell/phone within reach;with bed alarm set   Nurse Communication Mobility status        Time: 4742-5956 OT Time Calculation (min): 25 min  Charges: OT General Charges $OT Visit: 1 Visit OT Treatments $Self Care/Home Management : 23-37 mins  Lodema Hong, [place]  Pager [phone] Office [place] 09/25/2021, 1:13 PM

## 2021-09-25 NOTE — Plan of Care (Signed)

## 2021-09-25 NOTE — Telephone Encounter (Signed)
called

## 2021-09-25 NOTE — Progress Notes (Signed)
Physical Therapy Treatment Patient Details Name: Alisha Henderson MRN: [id number] DOB: February 24, 1951 Today's Date: 09/25/2021   History of Present Illness Pt is a 70 y.o. F who presents on 09/20/21 for evaluation of back pain. MRI showed acute compression fx's of T10 and T11 as well as chronic compression fx's of L2-5. Significant PMH: chronic back pain, anxiety, IBS.    PT Comments    Pt making slow steady progress. Pt's severe kyphosis and scoliosis make brace unwieldy especially in sitting with chest plate riding up into pt's throat despite readjusting brace down. Removed chest piece when pt was in chair. Continue to recommend SNF.    Recommendations for follow up therapy are one component of a multi-disciplinary discharge planning process, led by the attending physician.  Recommendations may be updated based on patient status, additional functional criteria and insurance authorization.  Follow Up Recommendations  Skilled nursing-short term rehab (<3 hours/day)     Assistance Recommended at Discharge Frequent or constant Supervision/Assistance  Equipment Recommendations  Rolling walker (2 wheels)    Recommendations for Other Services       Precautions / Restrictions Precautions Precautions: Fall Precaution Comments: unsteady on her feet, blind in L eye Required Braces or Orthoses: Spinal Brace Spinal Brace: Thoracolumbosacral orthotic;Applied in sitting position (Readjusted in standing) Spinal Brace Comments: Due to pt's body habitus chest plate up into throat when in sitting. Removed chest plate in sitting.     Mobility  Bed Mobility Overal bed mobility: Needs Assistance       Supine to sit: Min assist;HOB elevated     General bed mobility comments: Pt assisted bringing feet off of bed and then turning trunk to side. Assist to do this and elevate trunk into sitting. Pt could not tolerat full log roll prior to sittin.    Transfers Overall transfer level: Needs  assistance Equipment used: Rolling walker (2 wheels) Transfers: Sit to/from Stand Sit to Stand: Min assist           General transfer comment: Assist to bring hips up and for balance    Ambulation/Gait Ambulation/Gait assistance: Min assist;+2 safety/equipment Gait Distance (Feet): 8 Feet Assistive device: Rolling walker (2 wheels) Gait Pattern/deviations: Step-to pattern;Decreased step length - right;Decreased step length - left;Shuffle Gait velocity: decreased Gait velocity interpretation: <1.31 ft/sec, indicative of household ambulator General Gait Details: Assist for safety and support. Followed pt with recliner   Stairs             Wheelchair Mobility    Modified Rankin (Stroke Patients Only)       Balance Overall balance assessment: Needs assistance Sitting-balance support: Feet supported;Bilateral upper extremity supported Sitting balance-Leahy Scale: Poor Sitting balance - Comments: Pt with lean to lt and propped on UE Postural control: Left lateral lean Standing balance support: Bilateral upper extremity supported;During functional activity Standing balance-Leahy Scale: Poor Standing balance comment: rolling walker and min guard for static standing                            Cognition Arousal/Alertness: Awake/alert Behavior During Therapy: Anxious (tearful at times) Overall Cognitive Status: Within Functional Limits for tasks assessed                                          Exercises      General Comments  Pertinent Vitals/Pain Pain Assessment: Faces Faces Pain Scale: Hurts even more Pain Location: low back mid back and rt hip Pain Descriptors / Indicators: Grimacing;Guarding;Spasm Pain Intervention(s): Repositioned;Monitored during session;Limited activity within patient's tolerance    Home Living                          Prior Function            PT Goals (current goals can now be  found in the care plan section) Progress towards PT goals: Progressing toward goals    Frequency    Min 3X/week      PT Plan Current plan remains appropriate    Co-evaluation              AM-PAC PT "6 Clicks" Mobility   Outcome Measure  Help needed turning from your back to your side while in a flat bed without using bedrails?: A Little Help needed moving from lying on your back to sitting on the side of a flat bed without using bedrails?: A Little Help needed moving to and from a bed to a chair (including a wheelchair)?: A Little Help needed standing up from a chair using your arms (e.g., wheelchair or bedside chair)?: A Little Help needed to walk in hospital room?: A Lot Help needed climbing 3-5 steps with a railing? : Total 6 Click Score: 15    End of Session Equipment Utilized During Treatment: Back brace;Gait belt Activity Tolerance: Patient limited by pain Patient left: in chair;with call bell/phone within reach;with chair alarm set Nurse Communication: Mobility status PT Visit Diagnosis: Other abnormalities of gait and mobility (R26.89);Pain     Time: 1150-1210 PT Time Calculation (min) (ACUTE ONLY): 20 min  Charges:  $Gait Training: 8-22 mins                     [place] Pager [phone] Office [place] 09/25/2021, 12:56 PM

## 2021-09-25 NOTE — Progress Notes (Addendum)
Pt assessed, no iv at this time, provider aware, pt continues to refuse IV at this time, educated on importance of IV in hospital setting, no IV meds to give at this time

## 2021-09-26 DIAGNOSIS — M549 Dorsalgia, unspecified: Secondary | ICD-10-CM | POA: Diagnosis not present

## 2021-09-26 DIAGNOSIS — S22088A Other fracture of T11-T12 vertebra, initial encounter for closed fracture: Secondary | ICD-10-CM | POA: Diagnosis not present

## 2021-09-26 LAB — COMPREHENSIVE METABOLIC PANEL
ALT: 10 U/L (ref 0–44)
AST: 19 U/L (ref 15–41)
Albumin: 2.5 g/dL — ABNORMAL LOW (ref 3.5–5.0)
Alkaline Phosphatase: 85 U/L (ref 38–126)
Anion gap: 4 — ABNORMAL LOW (ref 5–15)
BUN: 5 mg/dL — ABNORMAL LOW (ref 8–23)
CO2: 24 mmol/L (ref 22–32)
Calcium: 8.7 mg/dL — ABNORMAL LOW (ref 8.9–10.3)
Chloride: 103 mmol/L (ref 98–111)
Creatinine, Ser: 0.47 mg/dL (ref 0.44–1.00)
GFR, Estimated: >60 mL/min (ref >60)
Glucose, Bld: 99 mg/dL (ref 70–99)
Potassium: 4 mmol/L (ref 3.5–5.1)
Sodium: 131 mmol/L — ABNORMAL LOW (ref 135–145)
Total Bilirubin: 0.8 mg/dL (ref 0.3–1.2)
Total Protein: 5.2 g/dL — ABNORMAL LOW (ref 6.5–8.1)

## 2021-09-26 MED ORDER — ACETAMINOPHEN 325 MG PO TABS: 650.0000 mg | ORAL_TABLET | Freq: Four times a day (QID) | ORAL | Status: DC | PRN

## 2021-09-26 MED ORDER — LORAZEPAM 1 MG PO TABS: 1.0000 mg | ORAL_TABLET | Freq: Three times a day (TID) | ORAL | 0 refills | Status: AC

## 2021-09-26 MED ORDER — OXYCODONE HCL 5 MG PO TABS: 5.0000 mg | ORAL_TABLET | ORAL | 0 refills | Status: DC | PRN

## 2021-09-26 MED ORDER — METHOCARBAMOL 500 MG PO TABS: 500.0000 mg | ORAL_TABLET | Freq: Four times a day (QID) | ORAL | 0 refills | Status: DC

## 2021-09-26 MED ORDER — POLYETHYLENE GLYCOL 3350 17 G PO PACK: 17.0000 g | PACK | Freq: Every day | ORAL | 0 refills | Status: AC | PRN

## 2021-09-26 NOTE — Discharge Summary (Signed)
Physician Discharge Summary  Wilmore QBH:[id number] DOB: 05-Jan-1951 DOA: 09/20/2021  PCP: Shirline Frees, MD  Admit date: 09/20/2021 Discharge date: 09/26/2021  Time spent: 33 minutes  Recommendations for Outpatient Follow-up:  going to skilled rehab for further management of back pain Needs Chem-12, CBC in about 1 week post discharge Wearing TLSO brace at all times when active and follow-up in the outpatient setting with neurosurgeon Dr. Trenton Gammon  Discharge Diagnoses:  MAIN problem for hospitalization   Acute T10-T11 acute compression fractures  Please see below for itemized issues addressed in [facility] [place]- refer to other progress notes for clarity if needed  Discharge Condition: Improved  Diet recommendation: Regular  Filed Weights   09/24/21 1200 09/26/21 0500  Weight: 46.7 kg 46.7 kg    History of present illness:  70 year old female Known history low back pain L2-L5 compression fractures seen 09/02/2021-declined vertebroplasty previously Dr. Durward Fortes. Admitted 10/23 for repeat muscle spasms in legs and radicular pain nonresponsive to Robaxin Orthopedics Dr. Durward Fortes consulted MRI back showed T10, T11 compression fracture--neurosurgery Dr. Trenton Gammon consulted Recommendations for TLSO bracing and trial of nonoperative measures Hospitalization complicated by patient noncompliance in addition to electrolyte disturbances    Hospital Course:  T10-11 acute compression fractures For spasm continue Robaxin prescription given on discharge for the same Pain is improved some-TLSO bracing in the outpatient setting when up and about no restrictions Needs outpatient follow-up with Dr. 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 Durward Fortes Neurosurgery Dr. Trenton Gammon  Discharge Exam: Vitals:   09/25/21 2000 09/26/21 0755  BP: (!) 139/57 (!) 145/48  Pulse: 95 85  Resp: 17 17  Temp: 98.1 F (36.7 C) 98 F (36.7 C)  SpO2: 98% 99%    Subj on day of d/c   Awake coherent no distress pain seems improved no chest pain no fever no chills Wants to mobilize more Eating drinking No stool  General Exam on discharge  EOMI NCAT no focal deficit seems to be in less distress S1-S2 no murmur no rub no gallop Chest clear no rales no rhonchi Able to straight leg raise little better than previously Abdomen is soft nontender no rebound no guarding Power is reasonable 5/5 in other extremities  Discharge Instructions   Discharge Instructions     Diet - low sodium heart healthy   Complete by: As directed    Increase activity slowly   Complete by: As directed       Allergies as of 09/26/2021       Reactions   Cephalosporins Anaphylaxis   Sulfa Antibiotics Anaphylaxis   Sulfites Anaphylaxis   Cephalexin Itching   Ciprofloxacin    Codeine Nausea And Vomiting   Other    Antibiotics cause anaphylaxis   Penicillins    Has patient had a PCN reaction causing immediate rash, facial/tongue/throat swelling, SOB or lightheadedness with hypotension: Yes- Anaphylaxis  Has patient had a PCN reaction causing severe rash involving mucus membranes or skin necrosis: No Has patient had a PCN reaction that required hospitalization No Has patient had a PCN reaction occurring within the last 10 years: No If all of the above answers are "NO", then may proceed  with Cephalosporin use.        Medication List     TAKE these medications    acetaminophen 325 MG tablet Commonly known as: TYLENOL Take 2 tablets (650 mg total) by mouth  every 6 (six) hours as needed for mild pain (or Fever >/= 101).   CALCIUM + D PO Take 1 tablet by mouth daily.   carboxymethylcellulose 0.5 % Soln Commonly known as: REFRESH PLUS Place 1 drop into both eyes 5 (five) times daily.   DAILY VITAMINS PO Take 15 mLs by mouth daily.   LORazepam 1 MG tablet Commonly known as: ATIVAN Take 1 tablet (1 mg total) by mouth every 8 (eight) hours.   methocarbamol 500 MG tablet Commonly known as: ROBAXIN Take 1 tablet (500 mg total) by mouth 4 (four) times daily.   oxyCODONE 5 MG immediate release tablet Commonly known as: Oxy IR/ROXICODONE Take 1 tablet (5 mg total) by mouth every 4 (four) hours as needed for moderate pain or severe pain.   polyethylene glycol 17 g packet Commonly known as: MIRALAX / GLYCOLAX Take 17 g by mouth daily as needed for mild constipation.   propranolol 10 MG tablet Commonly known as: INDERAL Take 10 mg by mouth 3 (three) times daily. Takes 10 mg first two doses and then 5 mg last dose.       Allergies  Allergen Reactions   Cephalosporins Anaphylaxis   Sulfa Antibiotics Anaphylaxis   Sulfites Anaphylaxis   Cephalexin Itching   Ciprofloxacin    Codeine Nausea And Vomiting   Other     Antibiotics cause anaphylaxis   Penicillins     Has patient had a PCN reaction causing immediate rash, facial/tongue/throat swelling, SOB or lightheadedness with hypotension: Yes- Anaphylaxis  Has patient had a PCN reaction causing severe rash involving mucus membranes or skin necrosis: No Has patient had a PCN reaction that required hospitalization No Has patient had a PCN reaction occurring within the last 10 years: No If all of the above answers are "NO", then may proceed with Cephalosporin use.       The results of significant diagnostics from this hospitalization (including imaging, microbiology, ancillary and laboratory) are listed below for reference.    Significant Diagnostic Studies: DG Pelvis 1-2  Views  Result Date: 09/21/2021 CLINICAL DATA:  Increasing back pain.  Muscle spasms. EXAM: PELVIS - 1-2 VIEW; LEFT FEMUR 2 VIEWS COMPARISON:  Pelvic radiograph 09/06/2020, left knee radiograph 09/04/2019 FINDINGS: Diffuse osteopenia. There is no evidence of pelvic fracture or diastasis. No pelvic bone lesions are seen. Chronic compression fracture of the L5 vertebral body. No fracture of the left femur. No left hip or left knee dislocation. Soft tissues are unremarkable. IMPRESSION: No acute osseous abnormality of the pelvis or left femur. Electronically Signed   By: Ileana Roup M.D.   On: 09/21/2021 12:49   MR LUMBAR SPINE WO CONTRAST  Result Date: 09/22/2021 CLINICAL DATA:  Compression fracture EXAM: MRI LUMBAR SPINE WITHOUT CONTRAST TECHNIQUE: Multiplanar, multisequence MR imaging of the lumbar spine was performed. No intravenous contrast was administered. COMPARISON:  Lumbar spine MRI 11/04/2020 FINDINGS: Segmentation:  Standard; the lowest disc space is designated L5-S1 Alignment: There is mild straightening of the normal lumbar lordosis. There is no antero or retrolisthesis. Vertebrae: There is an acute fracture of the T10 vertebral body with mild loss of vertebral body height centrally. There is no significant bony retropulsion. There is an additional acute fracture of the T11 vertebral body with up to approximately  50% loss of vertebral body height centrally. There is moderate bony retropulsion at this level with mild mass effect on the cord. Additional compression deformities of the L2 through L5 vertebral bodies are not significantly changed compared to the prior MRI from 11/04/2020, most severe at L5. There is mild bony retropulsion at L2 and L5 without significant spinal canal stenosis or mass effect on the cauda equina nerve roots. Conus medullaris and cauda equina: Conus extends to the L1-L2 level. Conus and cauda equina appear normal. Paraspinal and other soft tissues: The paraspinal soft  tissues are unremarkable. Disc levels: T10-T11: As above, there is moderate bony retropulsion of the T11 vertebral body resulting in mild-to-moderate spinal canal stenosis with the formation of the cord. There is no cord signal abnormality. T11-T12: No significant spinal canal or neural foraminal stenosis. T12-L1: No significant spinal canal or neural foraminal stenosis. L1-L2: There is mild bony retropulsion of the posterosuperior L2 endplate resulting in minimal spinal canal stenosis without significant neural foraminal stenosis. L2-L3: There is mild degenerative endplate change and bilateral facet arthropathy without significant spinal canal or neural foraminal stenosis. L3-L4: There is mild degenerative endplate change and bilateral facet arthropathy without significant spinal canal or neural foraminal stenosis. L4-L5: There is mild bony retropulsion of the posterosuperior L5 endplate with superimposed degenerative endplate change and facet arthropathy, without significant spinal canal or neural foraminal stenosis. L5-S1: There is a mild central disc protrusion, degenerative endplate change, and bilateral facet arthropathy without significant spinal canal or neural foraminal stenosis. IMPRESSION: 1. Acute fracture of the T10 vertebral body with mild loss of vertebral body height centrally but no bony retropulsion. 2. Acute compression fracture of the T11 vertebral body with up to approximately 50% loss of vertebral body height centrally and moderate bony retropulsion. There is resultant mild-to-moderate spinal canal stenosis with deformation of the cord but no cord signal abnormality. 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 Follow up postcontrast lumbar spine MRI in 2-3 months may be considered to exclude underlying lesion. 3. Other compression fractures at the L2 through L5 vertebral bodies are not significantly changed compared to the MRI from 11/05/2019. 4. Otherwise, multilevel  degenerative changes throughout the lumbar spine are detailed above without other high-grade spinal canal or neural foraminal stenosis. Electronically Signed   By: Valetta Mole M.D.   On: 09/22/2021 14:34   DG Femur Min 2 Views Left  Result Date: 09/21/2021 CLINICAL DATA:  Increasing back pain.  Muscle spasms. EXAM: PELVIS - 1-2 VIEW; LEFT FEMUR 2 VIEWS COMPARISON:  Pelvic radiograph 09/06/2020, left knee radiograph 09/04/2019 FINDINGS: Diffuse osteopenia. There is no evidence of pelvic fracture or diastasis. No pelvic bone lesions are seen. Chronic compression fracture of the L5 vertebral body. No fracture of the left femur. No left hip or left knee dislocation. Soft tissues are unremarkable. IMPRESSION: No acute osseous abnormality of the pelvis or left femur. Electronically Signed   By: Ileana Roup M.D.   On: 09/21/2021 12:49   XR Cervical Spine 2 or 3 views  Result Date: 09/02/2021 Films of the cervical spine were obtained in 2 projections.  These also included portion of the thoracic spine.  There is about a 60 degree thoracic kyphosis with what appears to be some compression of the proximal cysts thoracic spine.  There is no percussible tenderness in that area.  Cervical spine films did not demonstrate any listhesis or compression fracture.  There are some degenerative changes between C1 and C2.  Appears to have normal  cervical curvature in the lateral projection  XR Lumbar Spine 2-3 Views  Result Date: 09/02/2021 Films lumbar spine obtained in 2 projections.  There is a degenerative scoliosis to the left.  Diffuse compression fractures from L2-L5 that do not appear to different from the films obtained the 2021.  No listhesis.  Diffuse decreased bone density   Microbiology: Recent Results (from the past 240 hour(s))  Resp Panel by RT-PCR (Flu A&B, Covid) Nasopharyngeal Swab     Status: None   Collection Time: 09/21/21  8:52 PM   Specimen: Nasopharyngeal Swab; Nasopharyngeal(NP) swabs in  vial transport medium  Result Value Ref Range Status   SARS Coronavirus 2 by RT PCR NEGATIVE NEGATIVE Final    Comment: (NOTE) SARS-CoV-2 target nucleic acids are NOT DETECTED.  The SARS-CoV-2 RNA is generally detectable in upper respiratory specimens during the acute phase of infection. The lowest concentration of SARS-CoV-2 viral copies this assay can detect is 138 copies/mL. A negative result does not preclude SARS-Cov-2 infection and should not be used as the sole basis for treatment or other patient management decisions. A negative result may occur with  improper specimen collection/handling, submission of specimen other than nasopharyngeal swab, presence of viral mutation(s) within the areas targeted by this assay, and inadequate number of viral copies(<138 copies/mL). A negative result must be combined with clinical observations, patient history, and epidemiological information. The expected result is Negative.  Fact Sheet for Patients:  EntrepreneurPulse.com.au  Fact Sheet for Healthcare Providers:  IncredibleEmployment.be  This test is no t yet approved or cleared by the [place] FDA and  has been authorized for detection and/or diagnosis of SARS-CoV-2 by FDA under an Emergency Use Authorization (EUA). This EUA will remain  in effect (meaning this test can be used) for the duration of the COVID-19 declaration under Section 564(b)(1) of the Act, 21 U.S.C.section 360bbb-3(b)(1), unless the authorization is terminated  or revoked sooner.       Influenza A by PCR NEGATIVE NEGATIVE Final   Influenza B by PCR NEGATIVE NEGATIVE Final    Comment: (NOTE) The Xpert Xpress SARS-CoV-2/FLU/RSV plus assay is intended as an aid in the diagnosis of influenza from Nasopharyngeal swab specimens and should not be used as a sole basis for treatment. Nasal washings and aspirates are unacceptable for Xpert Xpress SARS-CoV-2/FLU/RSV testing.  Fact  Sheet for Patients: EntrepreneurPulse.com.au  Fact Sheet for Healthcare Providers: IncredibleEmployment.be  This test is not yet approved or cleared by the [place] FDA and has been authorized for detection and/or diagnosis of SARS-CoV-2 by FDA under an Emergency Use Authorization (EUA). This EUA will remain in effect (meaning this test can be used) for the duration of the COVID-19 declaration under Section 564(b)(1) of the Act, 21 U.S.C. section 360bbb-3(b)(1), unless the authorization is terminated or revoked.  Performed at [facility], [place] [address], [place], [place] [address]   SARS CORONAVIRUS 2 (TAT 6-24 HRS) Nasopharyngeal Nasopharyngeal Swab     Status: None   Collection Time: 09/25/21 12:00 PM   Specimen: Nasopharyngeal Swab  Result Value Ref Range Status   SARS Coronavirus 2 NEGATIVE NEGATIVE Final    Comment: (NOTE) SARS-CoV-2 target nucleic acids are NOT DETECTED.  The SARS-CoV-2 RNA is generally detectable in upper and lower respiratory specimens during the acute phase of infection. Negative results do not preclude SARS-CoV-2 infection, do not rule out co-infections with other pathogens, and should not be used as the sole basis for treatment or other patient management decisions. Negative  results must be combined with clinical observations, patient history, and epidemiological information. The expected result is Negative.  Fact Sheet for Patients: SugarRoll.be  Fact Sheet for Healthcare Providers: https://www.woods-mathews.com/  This test is not yet approved or cleared by the [place] FDA and  has been authorized for detection and/or diagnosis of SARS-CoV-2 by FDA under an Emergency Use Authorization (EUA). This EUA will remain  in effect (meaning this test can be used) for the duration of the COVID-19 declaration under Se ction 564(b)(1) of the Act, 21 U.S.C. section  360bbb-3(b)(1), unless the authorization is terminated or revoked sooner.  Performed at [facility], [place] [address] [place], [place] [address]      Labs: Basic Metabolic Panel: Recent Labs  Lab 09/22/21 [phone] 09/23/21 0202 09/24/21 0238 09/25/21 0252 09/26/21 0134  NA 131* 128* 130* 130* 131*  K 3.5 3.1* 2.9* 3.9 4.0  CL 98 98 99 100 103  CO2 20* 19* 24 24 24   GLUCOSE 95 86 88 101* 99  BUN 6* 8 9 6* 5*  CREATININE 0.69 0.67 0.62 0.60 0.47  CALCIUM 8.7* 8.6* 8.3* 8.6* 8.7*  MG  --   --  1.6*  --   --    Liver Function Tests: Recent Labs  Lab 09/25/21 0252 09/26/21 0134  AST 19 19  ALT 10 10  ALKPHOS 79 85  BILITOT 1.2 0.8  PROT 5.2* 5.2*  ALBUMIN 2.4* 2.5*   No results for input(s): LIPASE, AMYLASE in the last 168 hours. No results for input(s): AMMONIA in the last 168 hours. CBC: Recent Labs  Lab 09/21/21 0620 09/22/21 0953 09/25/21 0252  WBC 4.6 5.6 4.2  NEUTROABS 3.7  --  2.1  HGB 14.8 14.1 12.4  HCT 44.6 42.2 36.7  MCV 79.9* 78.6* 78.1*  PLT 299 274 208   Cardiac Enzymes: No results for input(s): CKTOTAL, CKMB, CKMBINDEX, TROPONINI in the last 168 hours. BNP: BNP (last 3 results) No results for input(s): BNP in the last 8760 hours.  ProBNP (last 3 results) No results for input(s): PROBNP in the last 8760 hours.  CBG: No results for input(s): GLUCAP in the last 168 hours.     Signed:  Nita Sells MD   [facility] 09/26/2021, 9:20 AM

## 2021-09-26 NOTE — TOC Progression Note (Signed)
Transition of Care [facility]) - Initial/Assessment Note    Patient Details  Name: Yakira Duquette MRN: [id number] Date of Birth: [birth year]  [facility] of [facility] CM/SW Contact:    Milinda Antis, [place] Phone Number: 09/26/2021, 12:44 PM  Clinical Narrative:                 CSW met with the patient at bedside and presented bed offers.  The patient is in agreement with going to [facility].  CSW called the facility and spoke with Helene Kelp in admissions.  The facility has sent clinicals to the insurance company and started insurance authorization.  Pending: insurance auth.   Patient Goals and CMS Choice        Expected Discharge Plan and Services     Discharge Planning Services: CM Consult   Living arrangements for the past 2 months: Single Family Home Expected Discharge Date: 09/26/21               DME Arranged: Gilford Rile, 3-N-1 DME Agency: [employer] Date DME Agency Contacted: 09/21/21 Time DME Agency Contacted: 15   HH Arranged: OT, PT, Nurse's Aide, Social Work [employer] Agency: [place] Date [place]: 09/21/21 Time [place]: 1115 Representative spoke with at [place]: Tommi Rumps  Prior Living Arrangements/Services Living arrangements for the past 2 months: [place] Lives with:: Self Patient language and need for interpreter reviewed:: Yes        Need for Family Participation in Patient Care: Yes (Comment) Care giver support system in place?: Yes (comment)   Criminal Activity/Legal Involvement Pertinent to Current Situation/Hospitalization: No - Comment as needed  Activities of Daily Living      Permission Sought/Granted                  Emotional Assessment   Attitude/Demeanor/Rapport: Lethargic Affect (typically observed): Accepting Orientation: : Oriented to Self, Oriented to Place, Oriented to  Time, Oriented to Situation Alcohol / Substance Use: Not Applicable Psych Involvement: No (comment)  Admission  diagnosis:  Pain [R52] Muscle spasm of back [M62.830] Intractable back pain [M54.9] Chronic low back pain, unspecified back pain laterality, unspecified whether sciatica present [M54.50, G89.29] Patient Active Problem List   Diagnosis Date Noted   Protein-calorie malnutrition, severe 09/22/2021   Intractable back pain 09/21/2021   Blindness of left eye 09/21/2021   Low back pain 09/24/2020   Neck pain, acute 09/24/2020   Endophthalmitis, acute, left 01/12/2020   

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
## 2021-09-26 NOTE — Progress Notes (Signed)
Pt refusing to take full dose of ativan ordered. Pt is anxious and needs med. She agrees to take half of dose. Half dose of ativan given per pt request. Documented in [facility]

## 2021-09-26 NOTE — Plan of Care (Signed)
  Problem: Education: Goal: Knowledge of General Education information will improve Description: Including pain rating scale, medication(s)/side effects and non-pharmacologic comfort measures Outcome: Adequate for Discharge   Problem: Health Behavior/Discharge Planning: Goal: Ability to manage health-related needs will improve Outcome: Adequate for Discharge   Problem: Clinical Measurements: Goal: Diagnostic test results will improve Outcome: Adequate for Discharge

## 2021-09-27 DIAGNOSIS — I10 Essential (primary) hypertension: Secondary | ICD-10-CM | POA: Diagnosis not present

## 2021-09-27 DIAGNOSIS — M549 Dorsalgia, unspecified: Secondary | ICD-10-CM | POA: Diagnosis not present

## 2021-09-27 DIAGNOSIS — S3992XA Unspecified injury of lower back, initial encounter: Secondary | ICD-10-CM | POA: Diagnosis not present

## 2021-09-27 DIAGNOSIS — Z20822 Contact with and (suspected) exposure to covid-19: Secondary | ICD-10-CM | POA: Diagnosis not present

## 2021-09-27 DIAGNOSIS — Z79899 Other long term (current) drug therapy: Secondary | ICD-10-CM | POA: Diagnosis not present

## 2021-09-27 DIAGNOSIS — X58XXXA Exposure to other specified factors, initial encounter: Secondary | ICD-10-CM | POA: Diagnosis not present

## 2021-09-27 DIAGNOSIS — S22088A Other fracture of T11-T12 vertebra, initial encounter for closed fracture: Secondary | ICD-10-CM | POA: Diagnosis not present

## 2021-09-27 NOTE — Progress Notes (Signed)
PROGRESS NOTE   Alisha Henderson  TDD:[id number] DOB: [birth year] DOA: 09/20/2021 PCP: Shirline Frees, MD  Brief Narrative:  70 year old female Known history low back pain L2-L5 compression fractures seen 09/02/2021-declined vertebroplasty previously Dr. Durward Fortes. Admitted 10/23 for repeat muscle spasms in legs and radicular pain nonresponsive to Robaxin Orthopedics Dr. Durward Fortes consulted MRI back showed T10, T11 compression fracture--neurosurgery Dr. 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  DVT prophylaxis: Lovenox Code Status: Full Family Communication: None Disposition:  Status is: Observation  The patient will require care spanning > 2 midnights and should be moved to inpatient because: Hyponatremia and is refusing IV so will need at least 24 hours to correct      Consultants:    Procedures:   Antimicrobials:    Subjective:  Awake coherent in nad no focal deficit Sitting out in chair and coherent--some pain limiting being seated  Objective: Vitals:   09/26/21 1139 09/26/21 1737 09/26/21 2206 09/27/21 0746  BP: (!) 122/34 (!) 104/49 (!) 140/44 (!) 100/30  Pulse: 88 98 84 100  Resp: 18 18 18 19   Temp: 97.8 F (36.6 C) 98 F (36.7 C) 98 F (36.7 C) 98 F (36.7 C)  TempSrc: Oral Oral Oral Oral  SpO2: 99% 98% 99% 99%  Weight:      Height:       No intake or output data in the 24 hours ending 09/27/21 1207  Filed Weights   09/24/21 1200 09/26/21  0500  Weight: 46.7 kg 46.7 kg    Examination:  coherent no distress extraocular movements intact S1-S2 no m/r/g More mobility to LE's Abdomen soft nontender no rebound no guarding Chest clear no rales rhonchi Affect is improved  Data Reviewed: personally reviewed   CBC    Component Value Date/Time   WBC 4.2 09/25/2021 0252   RBC 4.70 09/25/2021 0252   HGB 12.4 09/25/2021 0252   HCT 36.7 09/25/2021 0252   PLT 208 09/25/2021 0252   MCV 78.1 (L) 09/25/2021 0252   MCH 26.4 09/25/2021 0252   MCHC 33.8 09/25/2021 0252   RDW 14.6 09/25/2021 0252   LYMPHSABS 1.5 09/25/2021 0252   MONOABS 0.3 09/25/2021 0252   EOSABS 0.2 09/25/2021 0252   BASOSABS 0.0 09/25/2021 0252   CMP Latest Ref Rng & Units 09/26/2021 09/25/2021 09/24/2021  Glucose 70 - 99 mg/dL 99 101(H) 88  BUN 8 - 23 mg/dL 5(L) 6(L) 9  Creatinine 0.44 - 1.00 mg/dL 0.47 0.60 0.62  Sodium 135 - 145 mmol/L 131(L) 130(L) 130(L)  Potassium 3.5 - 5.1 mmol/L 4.0 3.9 2.9(L)  Chloride 98 - 111 mmol/L 103 100 99  CO2 22 - 32 mmol/L 24 24 24   Calcium 8.9 - 10.3 mg/dL 8.7(L) 8.6(L) 8.3(L)  Total Protein 6.5 - 8.1 g/dL 5.2(L) 5.2(L) -  Total Bilirubin 0.3 - 1.2 mg/dL 0.8 1.2 -  Alkaline Phos 38 - 126 U/L 85 79 -  AST 15 - 41 U/L 19 19 -  ALT 0 - 44 U/L 10 10 -     Radiology Studies: No results found.   Scheduled Meds:  docusate sodium  100 mg  Oral BID   enoxaparin (LOVENOX) injection  30 mg Subcutaneous Daily   LORazepam  1 mg Oral Q8H   methocarbamol  500 mg Oral QID   multivitamin with minerals  1 tablet Oral Daily   polyvinyl alcohol  1 drop Both Eyes 5 X Daily   propranolol  10 mg Oral BID   propranolol  5 mg Oral QHS   sodium chloride  2 g Oral BID WC   Continuous Infusions:   LOS: 1 day   Time spent: [place], MD [facility] To contact the attending provider between 7A-7P or the covering provider during after hours 7P-7A, please log into the web site [email or web address] and access using  universal [place] password for that web site. If you do not have the password, please call the hospital operator.  09/27/2021, 12:07 PM

## 2021-09-27 NOTE — Plan of Care (Signed)

## 2021-09-27 NOTE — TOC Progression Note (Signed)
Transition of Care [facility]) - Progression Note    Patient Details  Name: Alisha Henderson MRN: [id number] Date of Birth: [birth year]  [facility] of [facility]) CM/SW Contact  Joanne Chars, LCSW Phone Number: 09/27/2021, 9:43 AM  Clinical Narrative:   CSW called [employer].  No admisions office coverage on the weekend, will need to check back on Monday for update on insurance authorization.  CSW attempted to contact [place] directly, no response yet.       Barriers to Discharge: Continued Medical Work up  Expected Discharge Plan and Services     Discharge Planning Services: CM Consult   Living arrangements for the past 2 months: [place] Expected Discharge Date: 09/26/21               DME Arranged: Gilford Rile, 3-N-1 DME Agency: [employer] Date DME Agency Contacted: 09/21/21 Time DME Agency Contacted: 72   HH Arranged: OT, PT, Nurse's Aide, Social Work [employer] Agency: [place] Date [place]: 09/21/21 Time [place]: 1115 Representative spoke with at [place]: [place] Determinants of Health ([place]) Interventions    Readmission Risk Interventions No flowsheet data found.

## 2021-09-28 DIAGNOSIS — S22088A Other fracture of T11-T12 vertebra, initial encounter for closed fracture: Secondary | ICD-10-CM | POA: Diagnosis not present

## 2021-09-28 DIAGNOSIS — I10 Essential (primary) hypertension: Secondary | ICD-10-CM | POA: Diagnosis not present

## 2021-09-28 DIAGNOSIS — X58XXXA Exposure to other specified factors, initial encounter: Secondary | ICD-10-CM | POA: Diagnosis not present

## 2021-09-28 DIAGNOSIS — S3992XA Unspecified injury of lower back, initial encounter: Secondary | ICD-10-CM | POA: Diagnosis not present

## 2021-09-28 DIAGNOSIS — Z20822 Contact with and (suspected) exposure to covid-19: Secondary | ICD-10-CM | POA: Diagnosis not present

## 2021-09-28 DIAGNOSIS — M549 Dorsalgia, unspecified: Secondary | ICD-10-CM | POA: Diagnosis not present

## 2021-09-28 DIAGNOSIS — Z79899 Other long term (current) drug therapy: Secondary | ICD-10-CM | POA: Diagnosis not present

## 2021-09-28 MED ORDER — LORAZEPAM 0.5 MG PO TABS
0.5000 mg | ORAL_TABLET | Freq: Three times a day (TID) | ORAL | Status: DC
  Administered 2021-09-28 – 2021-09-30 (×4): 0.5 mg via ORAL
  Filled 2021-09-28 (×4): qty 1

## 2021-09-28 MED ORDER — POLYETHYLENE GLYCOL 3350 17 G PO PACK
17.0000 g | PACK | Freq: Every day | ORAL | Status: DC
  Administered 2021-09-29: 17 g via ORAL
  Filled 2021-09-28 (×3): qty 1

## 2021-09-28 NOTE — Progress Notes (Signed)
Declined lab work this morning

## 2021-09-28 NOTE — TOC Progression Note (Signed)
Transition of Care [facility]) - Progression Note    Patient Details  Name: Delainy Mcelhiney MRN: [id number] Date of Birth: Jun 27, 1951  Transition of Care [facility]) CM/SW Contact  Joanne Chars, LCSW Phone Number: 09/28/2021, 8:24 AM  Clinical Narrative:  Received message from Helene Kelp at [place].  SNF auth still pending.         Barriers to Discharge: Continued Medical Work up  Expected Discharge Plan and Services     Discharge Planning Services: CM Consult   Living arrangements for the past 2 months: [place] Expected Discharge Date: 09/26/21               DME Arranged: Gilford Rile, 3-N-1 DME Agency: [employer] Date DME Agency Contacted: 09/21/21 Time DME Agency Contacted: 54   HH Arranged: OT, PT, Nurse's Aide, Social Work [employer] Agency: [place] Date [place]: 09/21/21 Time [place]: 1115 Representative spoke with at [place]: [place] Determinants of Health ([place]) Interventions    Readmission Risk Interventions No flowsheet data found.

## 2021-09-28 NOTE — Progress Notes (Signed)
PROGRESS NOTE   Alisha Henderson  TRR:[id number] DOB: 1951-03-21 DOA: 09/20/2021 PCP: Shirline Frees, MD  Brief Narrative:  70 year old female Known history low back pain L2-L5 compression fractures seen 09/02/2021-declined vertebroplasty previously Dr. Durward Fortes. Admitted 10/23 for repeat muscle spasms in legs and radicular pain nonresponsive to Robaxin Orthopedics Dr. Durward Fortes consulted MRI back showed T10, T11 compression fracture--neurosurgery Dr. Trenton Gammon consulted Recommendations for TLSO bracing and trial of nonoperative measures Hospitalization complicated by patient noncompliance in addition to electrolyte disturbances  Hospital-Problem based course  T10-11 acute compression fractures continue Robaxin  4 times daily 500, tramadol 50 every 6 as needed oxycodone for more severe pain 5 mg every 4 as needed and IV morphine every 2 as needed for severe pain Await SNF bed insurance authorizations not obtained as yet Likely hypovolemic hyponatremia osmolality 269 Improving sodium and potassium- Patient is refusing blood draws and tells me that she has chronic hyponatremia-however reiterated the importance of this Elevated blood pressure Resume propranolol at 10 mg and 5 mg at bedtime 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  DVT prophylaxis: Lovenox Code Status: Full Family Communication: None Disposition:  Status is: Observation  The patient will require care spanning > 2 midnights and should be moved to inpatient because: Hyponatremia and is refusing IV so will need at least 24 hours to correct      Consultants:    Procedures:   Antimicrobials:    Subjective:  Somewhat emotional today Tells me has not had a stool and wants MiraLAX instead of Colace No chest pain no fever No nausea no vomiting Was able to sit out of bed some yesterday  Objective: Vitals:   09/27/21 0746 09/27/21 1557 09/27/21 2016 09/28/21 0752  BP:  (!) 100/30 (!) 130/32 (!) 128/34 (!) 139/52  Pulse: 100 99 94 93  Resp: 19 18 16 15   Temp: 98 F (36.7 C) 98 F (36.7 C)  98.2 F (36.8 C)  TempSrc: Oral Oral  Oral  SpO2: 99% 99% 99% 96%  Weight:      Height:       No intake or output data in the 24 hours ending 09/28/21 0853  Filed Weights   09/24/21 1200 09/26/21 0500  Weight: 46.7 kg 46.7 kg    Examination:  coherent  slightly emotional no distress S1-S2 no m/r/g Chest is clear without rales rhonchi or added sound Abdomen soft nontender no rebound no guarding Chest clear no rales rhonchi Affect is improved  Data Reviewed: personally reviewed   CBC    Component Value Date/Time   WBC 4.2 09/25/2021 0252   RBC 4.70 09/25/2021 0252   HGB 12.4 09/25/2021 0252   HCT 36.7 09/25/2021 0252   PLT 208 09/25/2021 0252   MCV 78.1 (L) 09/25/2021 0252   MCH 26.4 09/25/2021 0252   MCHC 33.8 09/25/2021 0252   RDW 14.6 09/25/2021 0252   LYMPHSABS 1.5 09/25/2021 0252   MONOABS 0.3 09/25/2021 0252   EOSABS 0.2 09/25/2021 0252   BASOSABS 0.0 09/25/2021 0252   CMP Latest Ref Rng & Units 09/26/2021 09/25/2021 09/24/2021  Glucose 70 - 99 mg/dL 99 101(H) 88  BUN 8 - 23 mg/dL 5(L) 6(L) 9  Creatinine 0.44 - 1.00 mg/dL 0.47 0.60 0.62  Sodium 135 - 145 mmol/L 131(L) 130(L) 130(L)  Potassium 3.5 - 5.1 mmol/L 4.0 3.9 2.9(L)  Chloride 98 - 111 mmol/L 103 100 99  CO2 22 - 32 mmol/L 24 24 24   Calcium 8.9 - 10.3 mg/dL 8.7(L) 8.6(L) 8.3(L)  Total Protein 6.5 - 8.1 g/dL 5.2(L) 5.2(L) -  Total Bilirubin 0.3 - 1.2 mg/dL 0.8 1.2 -  Alkaline Phos 38 - 126 U/L 85 79 -  AST 15 - 41 U/L 19 19 -  ALT 0 - 44 U/L 10 10 -     Radiology Studies: No results found.   Scheduled Meds:  docusate sodium  100 mg Oral BID   enoxaparin (LOVENOX) injection  30 mg Subcutaneous Daily   LORazepam  1 mg Oral Q8H   methocarbamol  500 mg Oral QID   multivitamin with minerals  1 tablet Oral Daily   polyvinyl alcohol  1 drop Both Eyes 5 X Daily    propranolol  10 mg Oral BID   propranolol  5 mg Oral QHS   sodium chloride  2 g Oral BID WC   Continuous Infusions:   LOS: 1 day   Time spent: [place], MD [facility] To contact the attending provider between 7A-7P or the covering provider during after hours 7P-7A, please log into the web site [email or web address] and access using universal [place] password for that web site. If you do not have the password, please call the hospital operator.  09/28/2021, 8:53 AM

## 2021-09-29 ENCOUNTER — Telehealth: Payer: Self-pay | Admitting: Orthopaedic Surgery

## 2021-09-29 DIAGNOSIS — S3992XA Unspecified injury of lower back, initial encounter: Secondary | ICD-10-CM | POA: Diagnosis not present

## 2021-09-29 DIAGNOSIS — I10 Essential (primary) hypertension: Secondary | ICD-10-CM | POA: Diagnosis not present

## 2021-09-29 DIAGNOSIS — Z79899 Other long term (current) drug therapy: Secondary | ICD-10-CM | POA: Diagnosis not present

## 2021-09-29 DIAGNOSIS — Z20822 Contact with and (suspected) exposure to covid-19: Secondary | ICD-10-CM | POA: Diagnosis not present

## 2021-09-29 DIAGNOSIS — S22088A Other fracture of T11-T12 vertebra, initial encounter for closed fracture: Secondary | ICD-10-CM | POA: Diagnosis not present

## 2021-09-29 DIAGNOSIS — X58XXXA Exposure to other specified factors, initial encounter: Secondary | ICD-10-CM | POA: Diagnosis not present

## 2021-09-29 LAB — COMPREHENSIVE METABOLIC PANEL
ALT: 14 U/L (ref 0–44)
AST: 21 U/L (ref 15–41)
Albumin: 3 g/dL — ABNORMAL LOW (ref 3.5–5.0)
Alkaline Phosphatase: 132 U/L — ABNORMAL HIGH (ref 38–126)
Anion gap: 6 (ref 5–15)
BUN: 8 mg/dL (ref 8–23)
CO2: 23 mmol/L (ref 22–32)
Calcium: 9.2 mg/dL (ref 8.9–10.3)
Chloride: 99 mmol/L (ref 98–111)
Creatinine, Ser: 0.57 mg/dL (ref 0.44–1.00)
GFR, Estimated: >60 mL/min (ref >60)
Glucose, Bld: 92 mg/dL (ref 70–99)
Potassium: 4.1 mmol/L (ref 3.5–5.1)
Sodium: 128 mmol/L — ABNORMAL LOW (ref 135–145)
Total Bilirubin: 0.8 mg/dL (ref 0.3–1.2)
Total Protein: 6.1 g/dL — ABNORMAL LOW (ref 6.5–8.1)

## 2021-09-29 NOTE — Plan of Care (Signed)
  Problem: Education: Goal: Knowledge of General Education information will improve Description: Including pain rating scale, medication(s)/side effects and non-pharmacologic comfort measures Outcome: Progressing   Problem: Health Behavior/Discharge Planning: Goal: Ability to manage health-related needs will improve Outcome: Progressing   Problem: Clinical Measurements: Goal: Ability to maintain clinical measurements within normal limits will improve Outcome: Progressing Goal: Will remain free from infection Outcome: Progressing Goal: Diagnostic test results will improve Outcome: Progressing Goal: Respiratory complications will improve Outcome: Progressing Goal: Cardiovascular complication will be avoided Outcome: Progressing   Problem: Activity: Goal: Risk for activity intolerance will decrease Outcome: Progressing   Problem: Nutrition: Goal: Adequate nutrition will be maintained Outcome: Progressing   Problem: Elimination: Goal: Will not experience complications related to urinary retention Outcome: Progressing   Problem: Pain Managment: Goal: General experience of comfort will improve Outcome: Progressing   Problem: Safety: Goal: Ability to remain free from injury will improve Outcome: Progressing   Problem: Skin Integrity: Goal: Risk for impaired skin integrity will decrease Outcome: Progressing   

## 2021-09-29 NOTE — Discharge Summary (Signed)
Physician Discharge Summary  Groves NIO:[id number] DOB: 1951/03/07 DOA: 09/20/2021  PCP: Shirline Frees, MD  Admit date: 09/20/2021 Discharge date: 09/29/2021  Time spent: 33 minutes  Recommendations for Outpatient Follow-up:  going to skilled rehab for further management of back pain Needs Chem-12, CBC in about 1 week post discharge Wearing TLSO brace at all times when active and follow-up in the outpatient setting with neurosurgeon Dr. Trenton Gammon  Discharge Diagnoses:  MAIN problem for hospitalization   Acute T10-T11 acute compression fractures  Please see below for itemized issues addressed in [place]- refer to other progress notes for clarity if needed  Discharge Condition: Improved  Diet recommendation: Regular  Filed Weights   09/24/21 1200 09/26/21 0500  Weight: 46.7 kg 46.7 kg    History of present illness:  70 year old female Known history low back pain L2-L5 compression fractures seen 09/02/2021-declined vertebroplasty previously Dr. Durward Fortes. Admitted 10/23 for repeat muscle spasms in legs and radicular pain nonresponsive to Robaxin Orthopedics Dr. Durward Fortes consulted MRI back showed T10, T11 compression fracture--neurosurgery Dr. Trenton Gammon consulted Recommendations for TLSO bracing and trial of nonoperative measures Hospitalization complicated by patient noncompliance in addition to electrolyte disturbances    Hospital Course:  T10-11 acute compression fractures For spasm continue Robaxin prescription given on discharge for the same Pain is improved some-TLSO bracing in the outpatient setting when up and about no restrictions Needs outpatient follow-up with Dr. 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 Durward Fortes Neurosurgery Dr. Trenton Gammon  Discharge Exam: Vitals:   09/28/21 2000 09/29/21 0806  BP: (!) 131/42 (!) 135/54  Pulse: 91 89  Resp: 16 18  Temp: 98.1 F (36.7 C) 98.6 F (37 C)  SpO2: 98%     Subj on day of d/c   Emotional today no distress and looks like she is willing to work with therapy Pain seems reasonably controlled  General Exam on discharge  Awake coherent no distress EOMI NCAT Chest clear no added sound abdomen soft Straight leg raise intact  Discharge Instructions   Discharge Instructions     Diet - low sodium heart healthy   Complete by: As directed    Increase activity slowly   Complete by: As directed       Allergies as of 09/29/2021       Reactions   Cephalosporins Anaphylaxis   Sulfa Antibiotics Anaphylaxis   Sulfites Anaphylaxis   Cephalexin Itching   Ciprofloxacin    Codeine Nausea And Vomiting   Other    Antibiotics cause anaphylaxis   Penicillins    Has patient had a PCN reaction causing immediate rash, facial/tongue/throat swelling, SOB or lightheadedness with hypotension: Yes- Anaphylaxis  Has patient had a PCN reaction causing severe rash involving mucus membranes or skin necrosis: No Has patient had a PCN reaction that required hospitalization No Has patient had a PCN reaction occurring within the last 10 years: No If all of the above answers are "NO", then may proceed with Cephalosporin use.        Medication List     TAKE  these medications    acetaminophen 325 MG tablet Commonly known as: TYLENOL Take 2 tablets (650 mg total) by mouth every 6 (six) hours as needed for mild pain (or Fever >/= 101).   CALCIUM + D PO Take  1 tablet by mouth daily.   carboxymethylcellulose 0.5 % Soln Commonly known as: REFRESH PLUS Place 1 drop into both eyes 5 (five) times daily.   DAILY VITAMINS PO Take 15 mLs by mouth daily.   LORazepam 1 MG tablet Commonly known as: ATIVAN Take 1 tablet (1 mg total) by mouth every 8 (eight) hours.   methocarbamol 500 MG tablet Commonly known as: ROBAXIN Take 1 tablet (500 mg total) by mouth 4 (four) times daily.   oxyCODONE 5 MG immediate release tablet Commonly known as: Oxy IR/ROXICODONE Take 1 tablet (5 mg total) by mouth every 4 (four) hours as needed for moderate pain or severe pain.   polyethylene glycol 17 g packet Commonly known as: MIRALAX / GLYCOLAX Take 17 g by mouth daily as needed for mild constipation.   propranolol 10 MG tablet Commonly known as: INDERAL Take 10 mg by mouth 3 (three) times daily. Takes 10 mg first two doses and then 5 mg last dose.       Allergies  Allergen Reactions   Cephalosporins Anaphylaxis   Sulfa Antibiotics Anaphylaxis   Sulfites Anaphylaxis   Cephalexin Itching   Ciprofloxacin    Codeine Nausea And Vomiting   Other     Antibiotics cause anaphylaxis   Penicillins     Has patient had a PCN reaction causing immediate rash, facial/tongue/throat swelling, SOB or lightheadedness with hypotension: Yes- Anaphylaxis  Has patient had a PCN reaction causing severe rash involving mucus membranes or skin necrosis: No Has patient had a PCN reaction that required hospitalization No Has patient had a PCN reaction occurring within the last 10 years: No If all of the above answers are "NO", then may proceed with Cephalosporin use.       The results of significant diagnostics from this hospitalization (including imaging, microbiology, ancillary and laboratory) are listed below for reference.    Significant Diagnostic Studies: DG Pelvis 1-2 Views  Result Date: 09/21/2021 CLINICAL DATA:  Increasing back pain.  Muscle spasms. EXAM: PELVIS  - 1-2 VIEW; LEFT FEMUR 2 VIEWS COMPARISON:  Pelvic radiograph 09/06/2020, left knee radiograph 09/04/2019 FINDINGS: Diffuse osteopenia. There is no evidence of pelvic fracture or diastasis. No pelvic bone lesions are seen. Chronic compression fracture of the L5 vertebral body. No fracture of the left femur. No left hip or left knee dislocation. Soft tissues are unremarkable. IMPRESSION: No acute osseous abnormality of the pelvis or left femur. Electronically Signed   By: Ileana Roup M.D.   On: 09/21/2021 12:49   MR LUMBAR SPINE WO CONTRAST  Result Date: 09/22/2021 CLINICAL DATA:  Compression fracture EXAM: MRI LUMBAR SPINE WITHOUT CONTRAST TECHNIQUE: Multiplanar, multisequence MR imaging of the lumbar spine was performed. No intravenous contrast was administered. COMPARISON:  Lumbar spine MRI 11/04/2020 FINDINGS: Segmentation:  Standard; the lowest disc space is designated L5-S1 Alignment: There is mild straightening of the normal lumbar lordosis. There is no antero or retrolisthesis. Vertebrae: There is an acute fracture of the T10 vertebral body with mild loss of vertebral body height centrally. There is no significant bony retropulsion. There is an additional acute fracture of the T11 vertebral body with up to approximately 50% loss of vertebral body height centrally. There is moderate bony retropulsion at this level with mild  mass effect on the cord. Additional compression deformities of the L2 through L5 vertebral bodies are not significantly changed compared to the prior MRI from 11/04/2020, most severe at L5. There is mild bony retropulsion at L2 and L5 without significant spinal canal stenosis or mass effect on the cauda equina nerve roots. Conus medullaris and cauda equina: Conus extends to the L1-L2 level. Conus and cauda equina appear normal. Paraspinal and other soft tissues: The paraspinal soft tissues are unremarkable. Disc levels: T10-T11: As above, there is moderate bony retropulsion of the T11  vertebral body resulting in mild-to-moderate spinal canal stenosis with the formation of the cord. There is no cord signal abnormality. T11-T12: No significant spinal canal or neural foraminal stenosis. T12-L1: No significant spinal canal or neural foraminal stenosis. L1-L2: There is mild bony retropulsion of the posterosuperior L2 endplate resulting in minimal spinal canal stenosis without significant neural foraminal stenosis. L2-L3: There is mild degenerative endplate change and bilateral facet arthropathy without significant spinal canal or neural foraminal stenosis. L3-L4: There is mild degenerative endplate change and bilateral facet arthropathy without significant spinal canal or neural foraminal stenosis. L4-L5: There is mild bony retropulsion of the posterosuperior L5 endplate with superimposed degenerative endplate change and facet arthropathy, without significant spinal canal or neural foraminal stenosis. L5-S1: There is a mild central disc protrusion, degenerative endplate change, and bilateral facet arthropathy without significant spinal canal or neural foraminal stenosis. IMPRESSION: 1. Acute fracture of the T10 vertebral body with mild loss of vertebral body height centrally but no bony retropulsion. 2. Acute compression fracture of the T11 vertebral body with up to approximately 50% loss of vertebral body height centrally and moderate bony retropulsion. There is resultant mild-to-moderate spinal canal stenosis with deformation of the cord but no cord signal abnormality. 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 Follow up postcontrast lumbar spine MRI in 2-3 months may be considered to exclude underlying lesion. 3. Other compression fractures at the L2 through L5 vertebral bodies are not significantly changed compared to the MRI from 11/05/2019. 4. Otherwise, multilevel degenerative changes throughout the lumbar spine are detailed above without other high-grade spinal canal or  neural foraminal stenosis. Electronically Signed   By: Valetta Mole M.D.   On: 09/22/2021 14:34   DG Femur Min 2 Views Left  Result Date: 09/21/2021 CLINICAL DATA:  Increasing back pain.  Muscle spasms. EXAM: PELVIS - 1-2 VIEW; LEFT FEMUR 2 VIEWS COMPARISON:  Pelvic radiograph 09/06/2020, left knee radiograph 09/04/2019 FINDINGS: Diffuse osteopenia. There is no evidence of pelvic fracture or diastasis. No pelvic bone lesions are seen. Chronic compression fracture of the L5 vertebral body. No fracture of the left femur. No left hip or left knee dislocation. Soft tissues are unremarkable. IMPRESSION: No acute osseous abnormality of the pelvis or left femur. Electronically Signed   By: Ileana Roup M.D.   On: 09/21/2021 12:49   XR Cervical Spine 2 or 3 views  Result Date: 09/02/2021 Films of the cervical spine were obtained in 2 projections.  These also included portion of the thoracic spine.  There is about a 60 degree thoracic kyphosis with what appears to be some compression of the proximal cysts thoracic spine.  There is no percussible tenderness in that area.  Cervical spine films did not demonstrate any listhesis or compression fracture.  There are some degenerative changes between C1 and C2.  Appears to have normal cervical curvature in the lateral projection  XR Lumbar Spine 2-3 Views  Result Date: 09/02/2021 Films  lumbar spine obtained in 2 projections.  There is a degenerative scoliosis to the left.  Diffuse compression fractures from L2-L5 that do not appear to different from the films obtained the 2021.  No listhesis.  Diffuse decreased bone density   Microbiology: Recent Results (from the past 240 hour(s))  Resp Panel by RT-PCR (Flu A&B, Covid) Nasopharyngeal Swab     Status: None   Collection Time: 09/21/21  8:52 PM   Specimen: Nasopharyngeal Swab; Nasopharyngeal(NP) swabs in vial transport medium  Result Value Ref Range Status   SARS Coronavirus 2 by RT PCR NEGATIVE NEGATIVE Final     Comment: (NOTE) SARS-CoV-2 target nucleic acids are NOT DETECTED.  The SARS-CoV-2 RNA is generally detectable in upper respiratory specimens during the acute phase of infection. The lowest concentration of SARS-CoV-2 viral copies this assay can detect is 138 copies/mL. A negative result does not preclude SARS-Cov-2 infection and should not be used as the sole basis for treatment or other patient management decisions. A negative result may occur with  improper specimen collection/handling, submission of specimen other than nasopharyngeal swab, presence of viral mutation(s) within the areas targeted by this assay, and inadequate number of viral copies(<138 copies/mL). A negative result must be combined with clinical observations, patient history, and epidemiological information. The expected result is Negative.  Fact Sheet for Patients:  EntrepreneurPulse.com.au  Fact Sheet for Healthcare Providers:  IncredibleEmployment.be  This test is no t yet approved or cleared by the [place] FDA and  has been authorized for detection and/or diagnosis of SARS-CoV-2 by FDA under an Emergency Use Authorization (EUA). This EUA will remain  in effect (meaning this test can be used) for the duration of the COVID-19 declaration under Section 564(b)(1) of the Act, 21 U.S.C.section 360bbb-3(b)(1), unless the authorization is terminated  or revoked sooner.       Influenza A by PCR NEGATIVE NEGATIVE Final   Influenza B by PCR NEGATIVE NEGATIVE Final    Comment: (NOTE) The Xpert Xpress SARS-CoV-2/FLU/RSV plus assay is intended as an aid in the diagnosis of influenza from Nasopharyngeal swab specimens and should not be used as a sole basis for treatment. Nasal washings and aspirates are unacceptable for Xpert Xpress SARS-CoV-2/FLU/RSV testing.  Fact Sheet for Patients: EntrepreneurPulse.com.au  Fact Sheet for Healthcare  Providers: IncredibleEmployment.be  This test is not yet approved or cleared by the [place] FDA and has been authorized for detection and/or diagnosis of SARS-CoV-2 by FDA under an Emergency Use Authorization (EUA). This EUA will remain in effect (meaning this test can be used) for the duration of the COVID-19 declaration under Section 564(b)(1) of the Act, 21 U.S.C. section 360bbb-3(b)(1), unless the authorization is terminated or revoked.  Performed at [facility], [place] [address] [place], [place] [address]   SARS CORONAVIRUS 2 (TAT 6-24 HRS) Nasopharyngeal Nasopharyngeal Swab     Status: None   Collection Time: 09/25/21 12:00 PM   Specimen: Nasopharyngeal Swab  Result Value Ref Range Status   SARS Coronavirus 2 NEGATIVE NEGATIVE Final    Comment: (NOTE) SARS-CoV-2 target nucleic acids are NOT DETECTED.  The SARS-CoV-2 RNA is generally detectable in upper and lower respiratory specimens during the acute phase of infection. Negative results do not preclude SARS-CoV-2 infection, do not rule out co-infections with other pathogens, and should not be used as the sole basis for treatment or other patient management decisions. Negative results must be combined with clinical observations, patient history, and epidemiological information. The expected result is Negative.  Fact Sheet for Patients: SugarRoll.be  Fact Sheet for Healthcare Providers: https://www.woods-mathews.com/  This test is not yet approved or cleared by the [place] FDA and  has been authorized for detection and/or diagnosis of SARS-CoV-2 by FDA under an Emergency Use Authorization (EUA). This EUA will remain  in effect (meaning this test can be used) for the duration of the COVID-19 declaration under Se ction 564(b)(1) of the Act, 21 U.S.C. section 360bbb-3(b)(1), unless the authorization is terminated or revoked sooner.  Performed at  [facility], [place] [address], [place], [place] [address]      Labs: Basic Metabolic Panel: Recent Labs  Lab 09/23/21 0202 09/24/21 0238 09/25/21 0252 09/26/21 0134 09/29/21 0149  NA 128* 130* 130* 131* 128*  K 3.1* 2.9* 3.9 4.0 4.1  CL 98 99 100 103 99  CO2 19* 24 24 24 23   GLUCOSE 86 88 101* 99 92  BUN 8 9 6* 5* 8  CREATININE 0.67 0.62 0.60 0.47 0.57  CALCIUM 8.6* 8.3* 8.6* 8.7* 9.2  MG  --  1.6*  --   --   --     Liver Function Tests: Recent Labs  Lab 09/25/21 0252 09/26/21 0134 09/29/21 0149  AST 19 19 21   ALT 10 10 14   ALKPHOS 79 85 132*  BILITOT 1.2 0.8 0.8  PROT 5.2* 5.2* 6.1*  ALBUMIN 2.4* 2.5* 3.0*    No results for input(s): LIPASE, AMYLASE in the last 168 hours. No results for input(s): AMMONIA in the last 168 hours. CBC: Recent Labs  Lab 09/25/21 0252  WBC 4.2  NEUTROABS 2.1  HGB 12.4  HCT 36.7  MCV 78.1*  PLT 208    Cardiac Enzymes: No results for input(s): CKTOTAL, CKMB, CKMBINDEX, TROPONINI in the last 168 hours. BNP: BNP (last 3 results) No results for input(s): BNP in the last 8760 hours.  ProBNP (last 3 results) No results for input(s): PROBNP in the last 8760 hours.  CBG: No results for input(s): GLUCAP in the last 168 hours.     Signed:  Nita Sells MD   [facility] 09/29/2021, 10:18 AM

## 2021-09-29 NOTE — Telephone Encounter (Signed)
Tried patient twice. Messages going to voicemail. From reviewing the chart the plan is for the patient to go to skilled nursing . She is also going to follow up with Dr. Trenton Gammon. Left a message for the patient that Dr. Durward Fortes is not in the office until tomorrow afternoon but I am available if she has any questions

## 2021-09-29 NOTE — Telephone Encounter (Signed)
The patient called me back. She is in the   . She was tearful and  said the brace was very uncomfortable and not helpful. She was also upset that she had to wait for a bedpan for almost an hour and didn't "make it". She is waiting on discharge to skilled nursing. I offered to talk to her nurse to see if someone could check the fitting of the brace but she asked that I not. She wants to get Dr. Durward Fortes back on her care team. I told her that I would discuss her with him when she is in the office . Hopefully she can get out to skilled nursing tomorrow

## 2021-09-29 NOTE — Progress Notes (Signed)
Patient has been educated on the medications that were refused today.Marland KitchenMarland Kitchen

## 2021-09-29 NOTE — Progress Notes (Addendum)
Occupational Therapy Treatment Patient Details Name: Alisha Henderson MRN: [id number] DOB: Jan 12, 1951 Today's Date: 09/29/2021   History of present illness Pt is a 70 y.o. F who presents on 09/20/21 for evaluation of back pain. MRI showed acute compression fx's of T10 and T11 as well as chronic compression fx's of L2-5. Significant PMH: chronic back pain, anxiety, IBS.   OT comments  Pt is slowly progressing towards OT goals. Pt remains limited by pain, activity tolerance, and balance. During session, pt completed toilet transfer with Min A, clothing management with Mod A, toilet hygiene with supervision, and functional mobility with Min A. Pt presents with high anxiety today, crying/tearful for majority of session. Pt reports that she is concerned about her medications, her level of progress, and insurance auth for rehab, among other things. Remains appropriate for SNF at d/c. Will continue to follow acutely.   Recommendations for follow up therapy are one component of a multi-disciplinary discharge planning process, led by the attending physician.  Recommendations may be updated based on patient status, additional functional criteria and insurance authorization.    Follow Up Recommendations  Skilled nursing-short term rehab (<3 hours/day)    Assistance Recommended at Discharge Frequent or constant Supervision/Assistance  Equipment Recommendations  Other (comment) (TBD)    Recommendations for Other Services      Precautions / Restrictions Precautions Precautions: Fall Precaution Comments: blind in L eye Required Braces or Orthoses: Spinal Brace Spinal Brace: Thoracolumbosacral orthotic;Applied in sitting position Spinal Brace Comments: Due to pt's body habitus chest plate up into throat when in sitting. Removed chest plate in sitting. Restrictions Weight Bearing Restrictions: No       Mobility Bed Mobility Overal bed mobility: Needs Assistance Bed Mobility: Rolling;Sidelying to  Sit Rolling: Min guard Sidelying to sit: Min assist;HOB elevated          Transfers Overall transfer level: Needs assistance Equipment used: Rolling walker (2 wheels) Transfers: Sit to/from Stand Sit to Stand: Min assist                Balance Overall balance assessment: Needs assistance Sitting-balance support: Feet supported;Bilateral upper extremity supported Sitting balance-Leahy Scale: Poor Sitting balance - Comments: Prefers BUE support   Standing balance support: During functional activity Standing balance-Leahy Scale: Poor Standing balance comment: Requires UE support in standing                           ADL either performed or assessed with clinical judgement   ADL Overall ADL's : Needs assistance/impaired                         Toilet Transfer: Minimal assistance;Cueing for safety;Ambulation;BSC;Rolling walker (2 wheels) Toilet Transfer Details (indicate cue type and reason): BSC over toielt. Cues for safety and technique. Toileting- Clothing Manipulation and Hygiene: Moderate assistance;Supervision/safety;Sitting/lateral lean Toileting - Clothing Manipulation Details (indicate cue type and reason): Mod A to manage clothing. Supervision for hygiene using sit/lateral lean method.     Functional mobility during ADLs: Minimal assistance;Cueing for safety;Rolling walker (2 wheels) (Cues for pacing) General ADL Comments: Pt limited by pain, activity tolerance, and balance.     Vision       Perception     Praxis      Cognition Arousal/Alertness: Awake/alert Behavior During Therapy: Anxious Overall Cognitive Status: No family/caregiver present to determine baseline cognitive functioning Area of Impairment: Memory  Memory: Decreased short-term memory;Decreased recall of precautions         General Comments: Highly anxious, emotionally labile and crying throughout session, worried about not her  medications and insurance auth for rehab.          Exercises     Shoulder Instructions       General Comments      Pertinent Vitals/ Pain       Pain Assessment: Faces Faces Pain Scale: Hurts even more Pain Location: low back mid back and rt hip Pain Descriptors / Indicators: Grimacing;Guarding;Spasm Pain Intervention(s): Limited activity within patient's tolerance;Monitored during session;Repositioned  Home Living                                          Prior Functioning/Environment              Frequency  Min 2X/week        Progress Toward Goals  OT Goals(current goals can now be found in the care plan section)  Progress towards OT goals: Progressing toward goals  Acute Rehab OT Goals Patient Stated Goal: rehab OT Goal Formulation: With patient Time For Goal Achievement: 10/07/21 Potential to Achieve Goals: Good ADL Goals Pt Will Perform Grooming: with modified independence;sitting Pt Will Perform Lower Body Bathing: with modified independence Pt Will Perform Lower Body Dressing: with modified independence;sit to/from stand Pt Will Transfer to Toilet: with modified independence;stand pivot transfer;bedside commode Pt Will Perform Toileting - Clothing Manipulation and hygiene: with modified independence  Plan Discharge plan remains appropriate    Co-evaluation                 AM-PAC OT "6 Clicks" Daily Activity     Outcome Measure   Help from another person eating meals?: A Little Help from another person taking care of personal grooming?: A Little Help from another person toileting, which includes using toliet, bedpan, or urinal?: A Lot Help from another person bathing (including washing, rinsing, drying)?: A Lot Help from another person to put on and taking off regular upper body clothing?: A Lot Help from another person to put on and taking off regular lower body clothing?: A Lot 6 Click Score: 14    End of Session  Equipment Utilized During Treatment: Rolling walker (2 wheels);Gait belt  OT Visit Diagnosis: Unsteadiness on feet (R26.81);Other abnormalities of gait and mobility (R26.89);Muscle weakness (generalized) (M62.81);Other symptoms and signs involving cognitive function;Pain   Activity Tolerance Patient limited by pain   Patient Left in chair;with call bell/phone within reach;with chair alarm set   Nurse Communication Mobility status        Time: 3382-5053 OT Time Calculation (min): 19 min  Charges: OT General Charges $OT Visit: 1 Visit OT Treatments $Self Care/Home Management : 8-22 mins  Nickalous Stingley C, OT/L  Acute Rehab No Name 09/29/2021, 2:07 PM

## 2021-09-29 NOTE — Telephone Encounter (Signed)
Pt has medical questions. Pt is asking for a call from Dr. Durward Fortes. Please call pt at [phone].

## 2021-09-29 NOTE — TOC Progression Note (Addendum)
Transition of Care [facility]) - Initial/Assessment Note    Patient Details  Name: Alisha Henderson MRN: [id number] Date of Birth: July 21, 1951  [facility]) CM/SW Contact:    Milinda Antis, [place] Phone Number: 09/29/2021, 9:50 AM  Clinical Narrative:                  09:50-  CSW called Helene Kelp with admissions at [facility] to inquire about insurance authorization and is awaiting a returned call.  10:45- CSW notified by [facility] that insurance auth has not been approved as of yet.    16:45-  CSW informed that insurance Josem Kaufmann has been approved.  Plan to d/c the patient to [facility] tomorrow.   Barriers to Discharge: Continued Medical Work up   Patient Goals and CMS Choice        Expected Discharge Plan and Services     Discharge Planning Services: CM Consult   Living arrangements for the past 2 months: [place] Expected Discharge Date: 09/26/21               DME Arranged: Gilford Rile, 3-N-1 DME Agency: [employer] Date DME Agency Contacted: 09/21/21 Time DME Agency Contacted: 36   HH Arranged: OT, PT, Nurse's Aide, Social Work [employer] Agency: [place] Date [place]: 09/21/21 Time [place]: 1115 Representative spoke with at [place]: Tommi Rumps  Prior Living Arrangements/Services Living arrangements for the past 2 months: [place] with:: Self Patient language and need for interpreter reviewed:: Yes        Need for Family Participation in Patient Care: Yes (Comment) Care giver support system in place?: Yes (comment)   Criminal Activity/Legal Involvement Pertinent to Current Situation/Hospitalization: No - Comment as needed  Activities of Daily Living      Permission Sought/Granted                  Emotional Assessment   Attitude/Demeanor/Rapport: Lethargic Affect (typically observed): Accepting Orientation: : Oriented to Self, Oriented to Place, Oriented to  Time, Oriented to  Situation Alcohol / Substance Use: Not Applicable Psych Involvement: No (comment)  Admission diagnosis:  Pain [R52] Muscle spasm of back [M62.830] Intractable back pain [M54.9] Chronic low back pain, unspecified back pain laterality, unspecified whether sciatica present [M54.50, G89.29] Patient Active Problem List   Diagnosis Date Noted   Protein-calorie malnutrition, severe 09/22/2021   Intractable back pain 09/21/2021   Blindness of left eye 09/21/2021   Low back pain 09/24/2020   Neck pain, acute 09/24/2020   Endophthalmitis, acute, left 01/12/2020   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

## 2021-09-29 NOTE — Progress Notes (Signed)
Physical Therapy Treatment Patient Details Name: Alisha Henderson MRN: [id number] DOB: 1951-10-22 Today's Date: 09/29/2021   History of Present Illness Pt is a 70 y.o. F who presents on 09/20/21 for evaluation of back pain. MRI showed acute compression fx's of T10 and T11 as well as chronic compression fx's of L2-5. Significant PMH: chronic back pain, anxiety, IBS.    PT Comments    Patient progressing well towards PT goals. Pt highly anxious today and emotionally labile, crying throughout session worried about her BP, taking certain medications, blood thinner, wearing SCDs, not having her shoes here etc. Recommend consulting the pastor as pt reports needing someone to talk too. Improved ambulation distance today with Min A and use of RW for support. Eager to get to rehab to maximize independence and mobility. Reviewed log roll technique for bed mobility. Will follow.    Recommendations for follow up therapy are one component of a multi-disciplinary discharge planning process, led by the attending physician.  Recommendations may be updated based on patient status, additional functional criteria and insurance authorization.  Follow Up Recommendations  Skilled nursing-short term rehab (<3 hours/day)     Assistance Recommended at Discharge Frequent or constant Supervision/Assistance  Equipment Recommendations  Rolling walker (2 wheels)    Recommendations for Other Services       Precautions / Restrictions Precautions Precautions: Fall Precaution Comments: blind in L eye Required Braces or Orthoses: Spinal Brace Spinal Brace: Thoracolumbosacral orthotic;Applied in sitting position Spinal Brace Comments: Due to pt's body habitus chest plate up into throat when in sitting. Removed chest plate in sitting. Restrictions Weight Bearing Restrictions: No     Mobility  Bed Mobility Overal bed mobility: Needs Assistance Bed Mobility: Rolling;Sidelying to Sit;Sit to Supine Rolling: Min  guard Sidelying to sit: Min assist;HOB elevated   Sit to supine: Min assist;HOB elevated   General bed mobility comments: "support my back," cues for log roll technique and to reach for rails, assist with trunk. Needs Min A to bring RLE into bed to return to supine.    Transfers Overall transfer level: Needs assistance Equipment used: Rolling walker (2 wheels) Transfers: Sit to/from Stand Sit to Stand: Min assist           General transfer comment: Assist to power to standing with cues for hand placement/technique, Stood from EOB x1, from chair x1. Declined staying in chair fo rlunch, "I don't want to bother you guys."    Ambulation/Gait Ambulation/Gait assistance: Min assist Gait Distance (Feet): 40 Feet (+ 80') Assistive device: Rolling walker (2 wheels) Gait Pattern/deviations: Decreased step length - right;Decreased step length - left;Step-through pattern;Narrow base of support Gait velocity: decreased Gait velocity interpretation: <1.31 ft/sec, indicative of household ambulator General Gait Details: Slow, mildly unsteady gait with narrow BoS, 1 seated rest break due to dizziness.   Stairs             Wheelchair Mobility    Modified Rankin (Stroke Patients Only)       Balance Overall balance assessment: Needs assistance Sitting-balance support: Feet supported;Bilateral upper extremity supported Sitting balance-Leahy Scale: Poor Sitting balance - Comments: Prefers BUE support   Standing balance support: During functional activity Standing balance-Leahy Scale: Poor Standing balance comment: Requires UE support in standing                            Cognition Arousal/Alertness: Awake/alert Behavior During Therapy: Anxious Overall Cognitive Status: No family/caregiver present to determine baseline cognitive  functioning Area of Impairment: Memory                     Memory: Decreased short-term memory;Decreased recall of precautions          General Comments: Highly anxious, emotionally labile and crying throughout session, worried about not taking the blood thinner, if she needs to be wearing the SCDs, not having her shoes, her BP etc. Recommend pt chat with chaplain, RN notified.        Exercises      General Comments        Pertinent Vitals/Pain Pain Assessment: No/denies pain    Home Living                          Prior Function            PT Goals (current goals can now be found in the care plan section) Progress towards PT goals: Progressing toward goals    Frequency    Min 3X/week      PT Plan Current plan remains appropriate    Co-evaluation              AM-PAC PT "6 Clicks" Mobility   Outcome Measure  Help needed turning from your back to your side while in a flat bed without using bedrails?: A Little Help needed moving from lying on your back to sitting on the side of a flat bed without using bedrails?: A Little Help needed moving to and from a bed to a chair (including a wheelchair)?: A Little Help needed standing up from a chair using your arms (e.g., wheelchair or bedside chair)?: A Little Help needed to walk in hospital room?: A Little Help needed climbing 3-5 steps with a railing? : A Lot 6 Click Score: 17    End of Session Equipment Utilized During Treatment: Back brace;Gait belt Activity Tolerance: Patient tolerated treatment well Patient left: in bed;with call bell/phone within reach;with bed alarm set Nurse Communication: Mobility status PT Visit Diagnosis: Other abnormalities of gait and mobility (R26.89)     Time: 0932-6712 PT Time Calculation (min) (ACUTE ONLY): 29 min  Charges:  $Gait Training: 8-22 mins $Therapeutic Activity: 8-22 mins                     Marisa Severin, PT, DPT Acute Rehabilitation Services Pager [phone] Office [phone]      Marguarite Arbour A [place] 09/29/2021, 1:27 PM

## 2021-09-30 ENCOUNTER — Telehealth: Payer: Self-pay | Admitting: Orthopaedic Surgery

## 2021-09-30 DIAGNOSIS — S22088A Other fracture of T11-T12 vertebra, initial encounter for closed fracture: Secondary | ICD-10-CM | POA: Diagnosis not present

## 2021-09-30 DIAGNOSIS — G8929 Other chronic pain: Secondary | ICD-10-CM

## 2021-09-30 DIAGNOSIS — F419 Anxiety disorder, unspecified: Secondary | ICD-10-CM | POA: Diagnosis not present

## 2021-09-30 DIAGNOSIS — R531 Weakness: Secondary | ICD-10-CM | POA: Diagnosis not present

## 2021-09-30 DIAGNOSIS — M545 Low back pain, unspecified: Secondary | ICD-10-CM

## 2021-09-30 DIAGNOSIS — M62830 Muscle spasm of back: Secondary | ICD-10-CM | POA: Diagnosis not present

## 2021-09-30 DIAGNOSIS — M62838 Other muscle spasm: Secondary | ICD-10-CM | POA: Diagnosis not present

## 2021-09-30 DIAGNOSIS — I10 Essential (primary) hypertension: Secondary | ICD-10-CM | POA: Diagnosis not present

## 2021-09-30 DIAGNOSIS — X58XXXA Exposure to other specified factors, initial encounter: Secondary | ICD-10-CM | POA: Diagnosis not present

## 2021-09-30 DIAGNOSIS — Z79899 Other long term (current) drug therapy: Secondary | ICD-10-CM | POA: Diagnosis not present

## 2021-09-30 DIAGNOSIS — Z7401 Bed confinement status: Secondary | ICD-10-CM | POA: Diagnosis not present

## 2021-09-30 DIAGNOSIS — M6281 Muscle weakness (generalized): Secondary | ICD-10-CM | POA: Diagnosis not present

## 2021-09-30 DIAGNOSIS — E43 Unspecified severe protein-calorie malnutrition: Secondary | ICD-10-CM

## 2021-09-30 DIAGNOSIS — R278 Other lack of coordination: Secondary | ICD-10-CM | POA: Diagnosis not present

## 2021-09-30 DIAGNOSIS — R2689 Other abnormalities of gait and mobility: Secondary | ICD-10-CM | POA: Diagnosis not present

## 2021-09-30 DIAGNOSIS — R262 Difficulty in walking, not elsewhere classified: Secondary | ICD-10-CM | POA: Diagnosis not present

## 2021-09-30 DIAGNOSIS — Z20822 Contact with and (suspected) exposure to covid-19: Secondary | ICD-10-CM | POA: Diagnosis not present

## 2021-09-30 DIAGNOSIS — E871 Hypo-osmolality and hyponatremia: Secondary | ICD-10-CM | POA: Diagnosis not present

## 2021-09-30 DIAGNOSIS — H5440 Blindness, one eye, unspecified eye: Secondary | ICD-10-CM | POA: Diagnosis not present

## 2021-09-30 DIAGNOSIS — R4189 Other symptoms and signs involving cognitive functions and awareness: Secondary | ICD-10-CM | POA: Diagnosis not present

## 2021-09-30 DIAGNOSIS — R002 Palpitations: Secondary | ICD-10-CM | POA: Diagnosis not present

## 2021-09-30 DIAGNOSIS — S22080A Wedge compression fracture of T11-T12 vertebra, initial encounter for closed fracture: Secondary | ICD-10-CM | POA: Diagnosis not present

## 2021-09-30 DIAGNOSIS — S22070A Wedge compression fracture of T9-T10 vertebra, initial encounter for closed fracture: Secondary | ICD-10-CM | POA: Diagnosis not present

## 2021-09-30 DIAGNOSIS — E440 Moderate protein-calorie malnutrition: Secondary | ICD-10-CM | POA: Diagnosis not present

## 2021-09-30 DIAGNOSIS — S3992XA Unspecified injury of lower back, initial encounter: Secondary | ICD-10-CM | POA: Diagnosis not present

## 2021-09-30 DIAGNOSIS — M549 Dorsalgia, unspecified: Secondary | ICD-10-CM | POA: Diagnosis not present

## 2021-09-30 LAB — SARS CORONAVIRUS 2 (TAT 6-24 HRS): SARS Coronavirus 2: NEGATIVE

## 2021-09-30 NOTE — TOC Transition Note (Signed)
Transition of Care [facility]) - CM/SW Discharge Note   Patient Details  Name: Alisha Henderson MRN: [id number] Date of Birth: 06-14-1951  [facility]) CM/SW Contact:  Milinda Antis, [place] Phone Number: 09/30/2021, 10:01 AM   Clinical Narrative:     Patient will DC to:  [facility] Anticipated DC date: 09/30/2021 Transport by:  Corey Harold   Per MD patient ready for DC to SNF. RN to call report prior to discharge [phone]. RN, patient, and facility notified of DC. Discharge Summary and FL2 sent to facility. DC packet on chart. Ambulance transport will be requested for patient when room number at facility is confirmed.   CSW will sign off for now as social work intervention is no longer needed. Please consult [place] again if new needs arise.      Barriers to Discharge: Continued Medical Work up   Patient Goals and CMS Choice        Discharge Placement                       Discharge Plan and Services   Discharge Planning Services: CM Consult            DME Arranged: Gilford Rile, 3-N-1 DME Agency: [employer] Date DME Agency Contacted: 09/21/21 Time DME Agency Contacted: 28   HH Arranged: OT, PT, Nurse's Aide, Social Work [employer] Agency: [place] Date [place]: 09/21/21 Time [place]: 1115 Representative spoke with at [place]: [place] ([place]) Interventions     Readmission Risk Interventions No flowsheet data found.

## 2021-09-30 NOTE — Telephone Encounter (Signed)
Patient called. Says the brace does not fit. Would lie another brace. Her call back number is [phone]

## 2021-09-30 NOTE — Telephone Encounter (Signed)
Patient is being transferred to SNF today.I will try her back tomorrow

## 2021-09-30 NOTE — Telephone Encounter (Signed)
Pt called and wants to speak with Alisha Henderson. She wouldn't tell me what about, she said Alisha Henderson would know.   CB [phone]

## 2021-09-30 NOTE — Plan of Care (Signed)

## 2021-09-30 NOTE — Plan of Care (Signed)
Pt ready for discharge, educated about bowel regiment wants to start at skilled nursing facility

## 2021-09-30 NOTE — Telephone Encounter (Signed)
I spoke with Alisha Henderson.  She is in the process of being transferred to skilled nursing at [facility].  She is still concerned because the brace is uncomfortable and does not appear to be fitting her properly.  We did discuss that she is at the nursing facility this can be adjusted by therapist and if it is absolutely an appropriate fitting they can contact Dr. Trenton Gammon for other options to stabilize her spine while her compression fractures are healing

## 2021-09-30 NOTE — Progress Notes (Signed)
Report given to [facility], patient prepared for discharge, taught education and belongings put together

## 2021-09-30 NOTE — Discharge Summary (Signed)
Physician Discharge Summary  [place] ZWC:[id number] DOB: 1951/05/05 DOA: 09/20/2021  PCP: Shirline Frees, MD  Admit date: 09/20/2021 Discharge date: 09/30/2021  Time spent: 33 minutes  Recommendations for Outpatient Follow-up:  going to skilled rehab for further management of back pain Needs Chem-12, CBC in about 1 week post discharge for hyponatremia monitoring. Wearing TLSO brace at all times when active and follow-up in the outpatient setting with neurosurgeon Dr. Trenton Gammon  Discharge Diagnoses:  Cc: Acute T10-T11 acute compression fractures -Hypovolemic hyponatremia -HTN -single eye blindness  Discharge Condition: Improved  Diet recommendation: Regular  Filed Weights   09/24/21 1200 09/26/21 0500  Weight: 46.7 kg 46.7 kg    History of present illness:  70 year old female Known history low back pain L2-L5 compression fractures seen 09/02/2021-declined vertebroplasty previously Dr. Durward Fortes. Admitted 10/23 for repeat muscle spasms in legs and radicular pain nonresponsive to Robaxin Orthopedics Dr. Durward Fortes consulted MRI back showed T10, T11 compression fracture--neurosurgery Dr. Trenton Gammon consulted Recommendations for TLSO bracing and trial of nonoperative measures Hospitalization complicated by patient noncompliance in addition to electrolyte disturbances   Hospital Course:  T10-11 acute compression fractures For spasm continue Robaxin prescription given on discharge for the same Pain is improved some-TLSO bracing in the outpatient setting when up and about no restrictions Needs outpatient follow-up with Dr. 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 osmolality 269 Improving sodium and potassium--initially was placed on fluid restriction and salt tablets which patient refused Salt tablets discontinued on discharge. 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 Durward Fortes Neurosurgery Dr. Trenton Gammon  Discharge Exam: Vitals:   09/29/21 2118 09/30/21 0700  BP: (!) 140/45 (!) 123/41  Pulse: 87 85  Resp: 16 18  Temp: 98.2 F (36.8 C) 98.3 F (36.8 C)  SpO2: 99% 98%    Subj on day of d/c Patient has complaints about the amount of IV fluids given to her during her hospital stay and that she is still in the hospital. She would like to go to rehab and has no questions at this time. She endorses moderately well controlled pain.   General Exam on discharge  General: NAD, able to participate in exam Cardiac: 2+ radial and PT pulses bilaterally Respiratory: Normal respiratory effort Extremities: no edema. WWP. Skin: warm and dry, no rashes noted Neuro: alert and oriented, no focal deficits Psych: Normal affect and mood  Discharge Instructions Discharged to rehab facility.  Discussed with patient need to monitor her electrolytes with her primary care physician within the next week and patient endorsed understanding and agreement with this plan.  She is also aware that she needs to follow-up with neurosurgery.  Discharge Instructions     Diet - low sodium heart healthy   Complete by: As directed    Increase activity slowly   Complete by: As directed       Allergies as of 09/30/2021       Reactions   Cephalosporins Anaphylaxis   Sulfa Antibiotics Anaphylaxis   Sulfites Anaphylaxis   Cephalexin Itching   Ciprofloxacin    Codeine Nausea And Vomiting   Other    Antibiotics cause anaphylaxis   Penicillins    Has patient had a PCN reaction causing immediate rash, facial/tongue/throat swelling, SOB or lightheadedness  with hypotension: Yes- Anaphylaxis  Has patient had a PCN reaction causing severe rash involving  mucus membranes or skin necrosis: No Has patient had a PCN reaction that required hospitalization No Has patient had a PCN reaction occurring within the last 10 years: No If all of the above answers are "NO", then may proceed with Cephalosporin use.        Medication List     TAKE these medications    acetaminophen 325 MG tablet Commonly known as: TYLENOL Take 2 tablets (650 mg total) by mouth every 6 (six) hours as needed for mild pain (or Fever >/= 101).   CALCIUM + D PO Take 1 tablet by mouth daily.   carboxymethylcellulose 0.5 % Soln Commonly known as: REFRESH PLUS Place 1 drop into both eyes 5 (five) times daily.   DAILY VITAMINS PO Take 15 mLs by mouth daily.   LORazepam 1 MG tablet Commonly known as: ATIVAN Take 1 tablet (1 mg total) by mouth every 8 (eight) hours.   methocarbamol 500 MG tablet Commonly known as: ROBAXIN Take 1 tablet (500 mg total) by mouth 4 (four) times daily.   oxyCODONE 5 MG immediate release tablet Commonly known as: Oxy IR/ROXICODONE Take 1 tablet (5 mg total) by mouth every 4 (four) hours as needed for moderate pain or severe pain.   polyethylene glycol 17 g packet Commonly known as: MIRALAX / GLYCOLAX Take 17 g by mouth daily as needed for mild constipation.   propranolol 10 MG tablet Commonly known as: INDERAL Take 10 mg by mouth 3 (three) times daily. Takes 10 mg first two doses and then 5 mg last dose.       Allergies  Allergen Reactions   Cephalosporins Anaphylaxis   Sulfa Antibiotics Anaphylaxis   Sulfites Anaphylaxis   Cephalexin Itching   Ciprofloxacin    Codeine Nausea And Vomiting   Other     Antibiotics cause anaphylaxis   Penicillins     Has patient had a PCN reaction causing immediate rash, facial/tongue/throat swelling, SOB or lightheadedness with hypotension: Yes- Anaphylaxis  Has patient had a PCN reaction causing severe rash involving mucus membranes or skin necrosis: No Has patient had a PCN reaction that  required hospitalization No Has patient had a PCN reaction occurring within the last 10 years: No If all of the above answers are "NO", then may proceed with Cephalosporin use.       The results of significant diagnostics from this hospitalization (including imaging, microbiology, ancillary and laboratory) are listed below for reference.    Significant Diagnostic Studies: DG Pelvis 1-2 Views  Result Date: 09/21/2021 CLINICAL DATA:  Increasing back pain.  Muscle spasms. EXAM: PELVIS - 1-2 VIEW; LEFT FEMUR 2 VIEWS COMPARISON:  Pelvic radiograph 09/06/2020, left knee radiograph 09/04/2019 FINDINGS: Diffuse osteopenia. There is no evidence of pelvic fracture or diastasis. No pelvic bone lesions are seen. Chronic compression fracture of the L5 vertebral body. No fracture of the left femur. No left hip or left knee dislocation. Soft tissues are unremarkable. IMPRESSION: No acute osseous abnormality of the pelvis or left femur. Electronically Signed   By: Ileana Roup M.D.   On: 09/21/2021 12:49   MR LUMBAR SPINE WO CONTRAST  Result Date: 09/22/2021 CLINICAL DATA:  Compression fracture EXAM: MRI LUMBAR SPINE WITHOUT CONTRAST TECHNIQUE: Multiplanar, multisequence MR imaging of the lumbar spine was performed. No intravenous contrast was administered. COMPARISON:  Lumbar spine MRI 11/04/2020 FINDINGS: Segmentation:  Standard; the lowest disc space is designated L5-S1 Alignment:  There is mild straightening of the normal lumbar lordosis. There is no antero or retrolisthesis. Vertebrae: There is an acute fracture of the T10 vertebral body with mild loss of vertebral body height centrally. There is no significant bony retropulsion. There is an additional acute fracture of the T11 vertebral body with up to approximately 50% loss of vertebral body height centrally. There is moderate bony retropulsion at this level with mild mass effect on the cord. Additional compression deformities of the L2 through L5 vertebral  bodies are not significantly changed compared to the prior MRI from 11/04/2020, most severe at L5. There is mild bony retropulsion at L2 and L5 without significant spinal canal stenosis or mass effect on the cauda equina nerve roots. Conus medullaris and cauda equina: Conus extends to the L1-L2 level. Conus and cauda equina appear normal. Paraspinal and other soft tissues: The paraspinal soft tissues are unremarkable. Disc levels: T10-T11: As above, there is moderate bony retropulsion of the T11 vertebral body resulting in mild-to-moderate spinal canal stenosis with the formation of the cord. There is no cord signal abnormality. T11-T12: No significant spinal canal or neural foraminal stenosis. T12-L1: No significant spinal canal or neural foraminal stenosis. L1-L2: There is mild bony retropulsion of the posterosuperior L2 endplate resulting in minimal spinal canal stenosis without significant neural foraminal stenosis. L2-L3: There is mild degenerative endplate change and bilateral facet arthropathy without significant spinal canal or neural foraminal stenosis. L3-L4: There is mild degenerative endplate change and bilateral facet arthropathy without significant spinal canal or neural foraminal stenosis. L4-L5: There is mild bony retropulsion of the posterosuperior L5 endplate with superimposed degenerative endplate change and facet arthropathy, without significant spinal canal or neural foraminal stenosis. L5-S1: There is a mild central disc protrusion, degenerative endplate change, and bilateral facet arthropathy without significant spinal canal or neural foraminal stenosis. IMPRESSION: 1. Acute fracture of the T10 vertebral body with mild loss of vertebral body height centrally but no bony retropulsion. 2. Acute compression fracture of the T11 vertebral body with up to approximately 50% loss of vertebral body height centrally and moderate bony retropulsion. There is resultant mild-to-moderate spinal canal  stenosis with deformation of the cord but no cord signal abnormality. 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 Follow up postcontrast lumbar spine MRI in 2-3 months may be considered to exclude underlying lesion. 3. Other compression fractures at the L2 through L5 vertebral bodies are not significantly changed compared to the MRI from 11/05/2019. 4. Otherwise, multilevel degenerative changes throughout the lumbar spine are detailed above without other high-grade spinal canal or neural foraminal stenosis. Electronically Signed   By: Valetta Mole M.D.   On: 09/22/2021 14:34   DG Femur Min 2 Views Left  Result Date: 09/21/2021 CLINICAL DATA:  Increasing back pain.  Muscle spasms. EXAM: PELVIS - 1-2 VIEW; LEFT FEMUR 2 VIEWS COMPARISON:  Pelvic radiograph 09/06/2020, left knee radiograph 09/04/2019 FINDINGS: Diffuse osteopenia. There is no evidence of pelvic fracture or diastasis. No pelvic bone lesions are seen. Chronic compression fracture of the L5 vertebral body. No fracture of the left femur. No left hip or left knee dislocation. Soft tissues are unremarkable. IMPRESSION: No acute osseous abnormality of the pelvis or left femur. Electronically Signed   By: Ileana Roup M.D.   On: 09/21/2021 12:49   XR Cervical Spine 2 or 3 views  Result Date: 09/02/2021 Films of the cervical spine were obtained in 2 projections.  These also included portion of the thoracic spine.  There is  about a 60 degree thoracic kyphosis with what appears to be some compression of the proximal cysts thoracic spine.  There is no percussible tenderness in that area.  Cervical spine films did not demonstrate any listhesis or compression fracture.  There are some degenerative changes between C1 and C2.  Appears to have normal cervical curvature in the lateral projection  XR Lumbar Spine 2-3 Views  Result Date: 09/02/2021 Films lumbar spine obtained in 2 projections.  There is a degenerative scoliosis to the left.   Diffuse compression fractures from L2-L5 that do not appear to different from the films obtained the 2021.  No listhesis.  Diffuse decreased bone density   Microbiology: Recent Results (from the past 240 hour(s))  Resp Panel by RT-PCR (Flu A&B, Covid) Nasopharyngeal Swab     Status: None   Collection Time: 09/21/21  8:52 PM   Specimen: Nasopharyngeal Swab; Nasopharyngeal(NP) swabs in vial transport medium  Result Value Ref Range Status   SARS Coronavirus 2 by RT PCR NEGATIVE NEGATIVE Final    Comment: (NOTE) SARS-CoV-2 target nucleic acids are NOT DETECTED.  The SARS-CoV-2 RNA is generally detectable in upper respiratory specimens during the acute phase of infection. The lowest concentration of SARS-CoV-2 viral copies this assay can detect is 138 copies/mL. A negative result does not preclude SARS-Cov-2 infection and should not be used as the sole basis for treatment or other patient management decisions. A negative result may occur with  improper specimen collection/handling, submission of specimen other than nasopharyngeal swab, presence of viral mutation(s) within the areas targeted by this assay, and inadequate number of viral copies(<138 copies/mL). A negative result must be combined with clinical observations, patient history, and epidemiological information. The expected result is Negative.  Fact Sheet for Patients:  EntrepreneurPulse.com.au  Fact Sheet for Healthcare Providers:  IncredibleEmployment.be  This test is no t yet approved or cleared by the [place] FDA and  has been authorized for detection and/or diagnosis of SARS-CoV-2 by FDA under an Emergency Use Authorization (EUA). This EUA will remain  in effect (meaning this test can be used) for the duration of the COVID-19 declaration under Section 564(b)(1) of the Act, 21 U.S.C.section 360bbb-3(b)(1), unless the authorization is terminated  or revoked sooner.        Influenza A by PCR NEGATIVE NEGATIVE Final   Influenza B by PCR NEGATIVE NEGATIVE Final    Comment: (NOTE) The Xpert Xpress SARS-CoV-2/FLU/RSV plus assay is intended as an aid in the diagnosis of influenza from Nasopharyngeal swab specimens and should not be used as a sole basis for treatment. Nasal washings and aspirates are unacceptable for Xpert Xpress SARS-CoV-2/FLU/RSV testing.  Fact Sheet for Patients: EntrepreneurPulse.com.au  Fact Sheet for Healthcare Providers: IncredibleEmployment.be  This test is not yet approved or cleared by the [place] FDA and has been authorized for detection and/or diagnosis of SARS-CoV-2 by FDA under an Emergency Use Authorization (EUA). This EUA will remain in effect (meaning this test can be used) for the duration of the COVID-19 declaration under Section 564(b)(1) of the Act, 21 U.S.C. section 360bbb-3(b)(1), unless the authorization is terminated or revoked.  Performed at [facility], [place] [address] [place], [place] [address]   SARS CORONAVIRUS 2 (TAT 6-24 HRS) Nasopharyngeal Nasopharyngeal Swab     Status: None   Collection Time: 09/25/21 12:00 PM   Specimen: Nasopharyngeal Swab  Result Value Ref Range Status   SARS Coronavirus 2 NEGATIVE NEGATIVE Final    Comment: (NOTE) SARS-CoV-2 target nucleic  acids are NOT DETECTED.  The SARS-CoV-2 RNA is generally detectable in upper and lower respiratory specimens during the acute phase of infection. Negative results do not preclude SARS-CoV-2 infection, do not rule out co-infections with other pathogens, and should not be used as the sole basis for treatment or other patient management decisions. Negative results must be combined with clinical observations, patient history, and epidemiological information. The expected result is Negative.  Fact Sheet for Patients: SugarRoll.be  Fact Sheet for Healthcare  Providers: https://www.woods-mathews.com/  This test is not yet approved or cleared by the [place] FDA and  has been authorized for detection and/or diagnosis of SARS-CoV-2 by FDA under an Emergency Use Authorization (EUA). This EUA will remain  in effect (meaning this test can be used) for the duration of the COVID-19 declaration under Se ction 564(b)(1) of the Act, 21 U.S.C. section 360bbb-3(b)(1), unless the authorization is terminated or revoked sooner.  Performed at [facility], [place] [address], [place], [place] [address]   SARS CORONAVIRUS 2 (TAT 6-24 HRS) Nasopharyngeal Nasopharyngeal Swab     Status: None   Collection Time: 09/29/21  9:15 AM   Specimen: Nasopharyngeal Swab  Result Value Ref Range Status   SARS Coronavirus 2 NEGATIVE NEGATIVE Final    Comment: (NOTE) SARS-CoV-2 target nucleic acids are NOT DETECTED.  The SARS-CoV-2 RNA is generally detectable in upper and lower respiratory specimens during the acute phase of infection. Negative results do not preclude SARS-CoV-2 infection, do not rule out co-infections with other pathogens, and should not be used as the sole basis for treatment or other patient management decisions. Negative results must be combined with clinical observations, patient history, and epidemiological information. The expected result is Negative.  Fact Sheet for Patients: SugarRoll.be  Fact Sheet for Healthcare Providers: https://www.woods-mathews.com/  This test is not yet approved or cleared by the [place] FDA and  has been authorized for detection and/or diagnosis of SARS-CoV-2 by FDA under an Emergency Use Authorization (EUA). This EUA will remain  in effect (meaning this test can be used) for the duration of the COVID-19 declaration under Se ction 564(b)(1) of the Act, 21 U.S.C. section 360bbb-3(b)(1), unless the authorization is terminated or revoked  sooner.  Performed at [facility], [place] [address] [place], [place] [address]      Labs: Basic Metabolic Panel: Recent Labs  Lab 09/24/21 0238 09/25/21 0252 09/26/21 0134 09/29/21 0149  NA 130* 130* 131* 128*  K 2.9* 3.9 4.0 4.1  CL 99 100 103 99  CO2 24 24 24 23   GLUCOSE 88 101* 99 92  BUN 9 6* 5* 8  CREATININE 0.62 0.60 0.47 0.57  CALCIUM 8.3* 8.6* 8.7* 9.2  MG 1.6*  --   --   --     Liver Function Tests: Recent Labs  Lab 09/25/21 0252 09/26/21 0134 09/29/21 0149  AST 19 19 21   ALT 10 10 14   ALKPHOS 79 85 132*  BILITOT 1.2 0.8 0.8  PROT 5.2* 5.2* 6.1*  ALBUMIN 2.4* 2.5* 3.0*    CBC: Recent Labs  Lab 09/25/21 0252  WBC 4.2  NEUTROABS 2.1  HGB 12.4  HCT 36.7  MCV 78.1*  PLT 208     Signed:  Richarda Osmond MD   [facility] 09/30/2021, 9:15 AM

## 2021-09-30 NOTE — Progress Notes (Signed)
   09/30/21 1000  Mobility  Activity Refused mobility (Pt wants to hold mobility until she receives medication and update from RN.)  Mobility Response RN notified

## 2021-09-30 NOTE — Telephone Encounter (Signed)
Tried to call patient but repeatdly went to voice mail. Let her know that she is getting the appropriate treatment

## 2021-10-01 ENCOUNTER — Telehealth: Payer: Self-pay | Admitting: Physician Assistant

## 2021-10-01 NOTE — Telephone Encounter (Signed)
Pt called stating she saw a PT yesterday and states they will be calling Dr.Whitfield in regards to her treatment; and she just wanted to make him aware.   [phone]

## 2021-10-01 NOTE — Telephone Encounter (Signed)
called

## 2021-10-02 ENCOUNTER — Telehealth: Payer: Self-pay | Admitting: Orthopaedic Surgery

## 2021-10-02 DIAGNOSIS — S22080A Wedge compression fracture of T11-T12 vertebra, initial encounter for closed fracture: Secondary | ICD-10-CM | POA: Diagnosis not present

## 2021-10-02 DIAGNOSIS — M62838 Other muscle spasm: Secondary | ICD-10-CM | POA: Diagnosis not present

## 2021-10-02 DIAGNOSIS — E871 Hypo-osmolality and hyponatremia: Secondary | ICD-10-CM | POA: Diagnosis not present

## 2021-10-02 DIAGNOSIS — S22070A Wedge compression fracture of T9-T10 vertebra, initial encounter for closed fracture: Secondary | ICD-10-CM | POA: Diagnosis not present

## 2021-10-02 NOTE — Telephone Encounter (Signed)
I spoke with the patient today.  She has been told by her spine surgeon that she needs to go and have her brace adjusted so it fits properly.  She says she just does not know if she can do this.  She does not know she can get up into a van.  I explained to her that her nursing facility will have a handicap Lucianne Lei with a lift that should be able to lift her up in her wheelchair.  Seems like the brace has been a big problem for her and I encouraged her that this would be the best way to get a proper fit

## 2021-10-02 NOTE — Telephone Encounter (Signed)
Pt called requesting a call back from [place]. Pt needs to speak to her concerning her back brace. Please call pt as soon as possible. Pt phone number is [phone].

## 2021-10-06 ENCOUNTER — Telehealth: Payer: Self-pay

## 2021-10-06 ENCOUNTER — Telehealth: Payer: Self-pay | Admitting: Orthopaedic Surgery

## 2021-10-06 DIAGNOSIS — F419 Anxiety disorder, unspecified: Secondary | ICD-10-CM | POA: Diagnosis not present

## 2021-10-06 DIAGNOSIS — S22070A Wedge compression fracture of T9-T10 vertebra, initial encounter for closed fracture: Secondary | ICD-10-CM | POA: Diagnosis not present

## 2021-10-06 DIAGNOSIS — S22080A Wedge compression fracture of T11-T12 vertebra, initial encounter for closed fracture: Secondary | ICD-10-CM | POA: Diagnosis not present

## 2021-10-06 NOTE — Telephone Encounter (Signed)
Leonia Reader called in regards to the patient Alisha Henderson. She wants to transfer care for her back to Dr. Durward Fortes. I instructed her to send over her medical records and we would reach out to her to set up an appointment

## 2021-10-06 NOTE — Telephone Encounter (Signed)
Pt called stating that the rehab she is in is stating that she needs to do PT using resistance bands pt states that she has been in this facility since Friday and they have done 2 secessions each for 15 min. Patient states that she doesn't need nursing care she just needs to get back on her feet she stated if she doesn't get out of that bed she is going to die.  She stated that resistance bands is going to put stress on her spine. She stated that she is scared that she isn't moving enough and she is going to get a blood clot and die.  She wanted Dr. Durward Fortes to advise and let her know what to do she feels as if she is worse now.  Please advise.

## 2021-10-07 DIAGNOSIS — R002 Palpitations: Secondary | ICD-10-CM | POA: Diagnosis not present

## 2021-10-07 DIAGNOSIS — F419 Anxiety disorder, unspecified: Secondary | ICD-10-CM | POA: Diagnosis not present

## 2021-10-07 DIAGNOSIS — M549 Dorsalgia, unspecified: Secondary | ICD-10-CM | POA: Diagnosis not present

## 2021-10-07 NOTE — Telephone Encounter (Signed)
agree

## 2021-10-16 ENCOUNTER — Other Ambulatory Visit: Payer: Self-pay

## 2021-10-16 ENCOUNTER — Ambulatory Visit (INDEPENDENT_AMBULATORY_CARE_PROVIDER_SITE_OTHER): Payer: BC Managed Care – PPO | Admitting: Orthopaedic Surgery

## 2021-10-16 ENCOUNTER — Encounter: Payer: Self-pay | Admitting: Orthopaedic Surgery

## 2021-10-16 DIAGNOSIS — S22080A Wedge compression fracture of T11-T12 vertebra, initial encounter for closed fracture: Secondary | ICD-10-CM | POA: Diagnosis not present

## 2021-10-16 DIAGNOSIS — M62838 Other muscle spasm: Secondary | ICD-10-CM | POA: Diagnosis not present

## 2021-10-16 DIAGNOSIS — G8929 Other chronic pain: Secondary | ICD-10-CM

## 2021-10-16 DIAGNOSIS — S22070A Wedge compression fracture of T9-T10 vertebra, initial encounter for closed fracture: Secondary | ICD-10-CM | POA: Diagnosis not present

## 2021-10-16 DIAGNOSIS — M545 Low back pain, unspecified: Secondary | ICD-10-CM

## 2021-10-16 DIAGNOSIS — F419 Anxiety disorder, unspecified: Secondary | ICD-10-CM | POA: Diagnosis not present

## 2021-10-16 NOTE — Progress Notes (Signed)
Office Visit Note   Patient: Alisha Henderson           Date of Birth: 02/18/1951           MRN: [id number] Visit Date: 10/16/2021              Requested by: Shirline Frees, MD [place],   [id number] PCP: Shirline Frees, MD   Assessment & Plan: Visit Diagnoses: No diagnosis found.  Plan: Patient is a 70 year old woman who is a follow-up from the hospital.  She was hospitalized over 2 weeks ago for compression fractures without impingement on the spinal cord in her thoracic spine.  She was placed in a TLSO brace which she is found very uncomfortable.  She has had it adjusted and states that its not being put on properly.  She is also concerned about pressure sores on her buttock though she is not tender she says the nurses had some concerns.  She is scheduled to go home from the nursing facility.  We had a long conversation with her.  We have made a referral for Dr. Trenton Gammon to follow-up with her.  She is now agreeable to vertebroplasty as this may be the best option to get rid of her pain and the brace.  We did adjust her brace that was fitting properly.  Also wrote notes to nursing facility to ensure that she continues while she is there to be checked for any areas of pressure.  She would do better ambulating with a walker and we recommended this.  If she cannot get a walker from the nursing facility she may contact us and we will make this happen for her.  She actually has fairly good strength and stands up out of her wheelchair without too much difficulty  Follow-Up Instructions: No follow-ups on file.   Orders:  No orders of the defined types were placed in this encounter.  No orders of the defined types were placed in this encounter.     Procedures: No procedures performed   Clinical Data: No additional findings.   Subjective: Chief Complaint  Patient presents with   Lower Back - Follow-up  Patient presents today for follow up on her back  pain. She was given a custom back brace. She states that she does not like and it is not manageable for her.     Review of Systems  All other systems reviewed and are negative.   Objective: Vital Signs: There were no vitals taken for this visit.  Physical Exam Constitutional:      Appearance: Normal appearance.  Pulmonary:     Effort: Pulmonary effort is normal.     Breath sounds: Normal breath sounds.  Neurological:     Mental Status: She is alert.    Ortho Exam Examination brace is loose in the back and she has one of the chest straps below her armpit.  This was adjusted.  Brace is well-padded but all bony pressure-point studies were evaluated for any pressure with brace.  No pressure sores antibiotics were appreciated today.  No erythema no areas of skin breakdown.  She has 4 out of 5 strength with grip bilaterally and good 4-5 strength that is equal with resisted extension flexion.  Distal pulses are intact. Specialty Comments:  No specialty comments available.  Imaging: No results found.   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  No past surgical history on file. Social History   Occupational History   Not on file  Tobacco Use   Smoking status: Never   Smokeless tobacco: Never  Substance  and Sexual Activity   Alcohol use: No   Drug use: No   Sexual activity: Not on file

## 2021-10-22 DIAGNOSIS — E46 Unspecified protein-calorie malnutrition: Secondary | ICD-10-CM | POA: Diagnosis not present

## 2021-10-22 DIAGNOSIS — F419 Anxiety disorder, unspecified: Secondary | ICD-10-CM | POA: Diagnosis not present

## 2021-10-22 DIAGNOSIS — S32000A Wedge compression fracture of unspecified lumbar vertebra, initial encounter for closed fracture: Secondary | ICD-10-CM | POA: Diagnosis not present

## 2021-10-22 DIAGNOSIS — R002 Palpitations: Secondary | ICD-10-CM | POA: Diagnosis not present

## 2021-10-22 DIAGNOSIS — Z1329 Encounter for screening for other suspected endocrine disorder: Secondary | ICD-10-CM | POA: Diagnosis not present

## 2021-10-30 ENCOUNTER — Telehealth: Payer: Self-pay | Admitting: Orthopaedic Surgery

## 2021-10-30 ENCOUNTER — Other Ambulatory Visit: Payer: Self-pay

## 2021-10-30 DIAGNOSIS — S22080D Wedge compression fracture of T11-T12 vertebra, subsequent encounter for fracture with routine healing: Secondary | ICD-10-CM | POA: Diagnosis not present

## 2021-10-30 DIAGNOSIS — M545 Low back pain, unspecified: Secondary | ICD-10-CM

## 2021-10-30 DIAGNOSIS — G8929 Other chronic pain: Secondary | ICD-10-CM

## 2021-10-30 DIAGNOSIS — I10 Essential (primary) hypertension: Secondary | ICD-10-CM | POA: Diagnosis not present

## 2021-10-30 DIAGNOSIS — S22070D Wedge compression fracture of T9-T10 vertebra, subsequent encounter for fracture with routine healing: Secondary | ICD-10-CM | POA: Diagnosis not present

## 2021-10-30 NOTE — Telephone Encounter (Signed)
Pt's daughter Earvin Hansen called requesting a call back from Dr. Durward Fortes. She states it's not urgent but would like a call to have a discuss about pt. Please call pt at [phone].

## 2021-10-30 NOTE — Telephone Encounter (Signed)
no

## 2021-10-30 NOTE — Telephone Encounter (Signed)
Patient called. She says Dr. Trenton Gammon will not do the procedure on her back. What is the next steps she should take? Her call back number is [phone]

## 2021-10-30 NOTE — Telephone Encounter (Signed)
Called and relayed this information to patient.

## 2021-10-30 NOTE — Telephone Encounter (Signed)
Refer to Dr Estanislado Pandy at [facility] radiology for  vertebroplasty

## 2021-10-30 NOTE — Telephone Encounter (Signed)
Spoke with patient and made the referral. She wants to know if this procedure "will make the rest of my bones soft". Please advise and i'll call her back. Thanks!

## 2021-10-31 NOTE — Telephone Encounter (Signed)
called

## 2021-11-04 DIAGNOSIS — M8000XS Age-related osteoporosis with current pathological fracture, unspecified site, sequela: Secondary | ICD-10-CM | POA: Diagnosis not present

## 2021-11-04 DIAGNOSIS — S32000D Wedge compression fracture of unspecified lumbar vertebra, subsequent encounter for fracture with routine healing: Secondary | ICD-10-CM | POA: Diagnosis not present

## 2021-11-14 ENCOUNTER — Telehealth: Payer: Self-pay | Admitting: Radiology

## 2021-11-14 DIAGNOSIS — S22000A Wedge compression fracture of unspecified thoracic vertebra, initial encounter for closed fracture: Secondary | ICD-10-CM

## 2021-11-14 NOTE — Telephone Encounter (Signed)
Please see message from Dr. Arlean Hopping staff below.  OK to enter order for CT? Thanks.  I had Dr. Estanislado Pandy review Ms. Holloway's scan. He wants her to have a CT thoracic spine wo prior to the procedure because he is not sure if he can treat T11 also bc of retropulsion and considering it's been some time since her last scan. He can treat T10. Is there any way you can order this prior? I tried to get it done but since he hasn't seen the pt then he can't order this imaging.

## 2021-11-14 NOTE — Telephone Encounter (Signed)
Ok to order    thanks

## 2021-11-14 NOTE — Telephone Encounter (Signed)
Order has been entered. I did not enter STAT and chose [facility] for where study should be done. I was not sure if you needed to change this based on your conversation with Caryl Pina.  Thanks.

## 2021-11-14 NOTE — Addendum Note (Signed)
Addended by: Meyer Cory on: 11/14/2021 10:02 AM   Modules accepted: Orders

## 2021-11-17 ENCOUNTER — Ambulatory Visit (INDEPENDENT_AMBULATORY_CARE_PROVIDER_SITE_OTHER): Payer: BC Managed Care – PPO | Admitting: Rehabilitative and Restorative Service Providers"

## 2021-11-17 ENCOUNTER — Encounter: Payer: Self-pay | Admitting: Rehabilitative and Restorative Service Providers"

## 2021-11-17 ENCOUNTER — Other Ambulatory Visit: Payer: Self-pay

## 2021-11-17 DIAGNOSIS — M6281 Muscle weakness (generalized): Secondary | ICD-10-CM | POA: Diagnosis not present

## 2021-11-17 DIAGNOSIS — R262 Difficulty in walking, not elsewhere classified: Secondary | ICD-10-CM

## 2021-11-17 DIAGNOSIS — M545 Low back pain, unspecified: Secondary | ICD-10-CM | POA: Diagnosis not present

## 2021-11-17 DIAGNOSIS — M546 Pain in thoracic spine: Secondary | ICD-10-CM

## 2021-11-17 DIAGNOSIS — R293 Abnormal posture: Secondary | ICD-10-CM

## 2021-11-17 DIAGNOSIS — G8929 Other chronic pain: Secondary | ICD-10-CM
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
## 2021-11-17 NOTE — Telephone Encounter (Signed)
This is scheduled for 12/11/21

## 2021-12-03 ENCOUNTER — Other Ambulatory Visit: Payer: Self-pay

## 2021-12-03 ENCOUNTER — Ambulatory Visit (INDEPENDENT_AMBULATORY_CARE_PROVIDER_SITE_OTHER): Payer: BC Managed Care – PPO | Admitting: Physical Therapy

## 2021-12-03 DIAGNOSIS — R293 Abnormal posture: Secondary | ICD-10-CM | POA: Diagnosis not present

## 2021-12-03 DIAGNOSIS — M546 Pain in thoracic spine: Secondary | ICD-10-CM | POA: Diagnosis not present

## 2021-12-03 DIAGNOSIS — M6281 Muscle weakness (generalized): Secondary | ICD-10-CM

## 2021-12-03 DIAGNOSIS — M545 Low back pain, unspecified: Secondary | ICD-10-CM

## 2021-12-03 DIAGNOSIS — R262 Difficulty in walking, not elsewhere classified: Secondary | ICD-10-CM | POA: Diagnosis not present

## 2021-12-03 DIAGNOSIS — G8929 Other chronic pain: Secondary | ICD-10-CM

## 2021-12-03 NOTE — Patient Instructions (Signed)
Access Code: [email or web address] URL: [email or web address] Date: 12/03/2021 Prepared by: Elsie Ra  Exercises Supine Lower Trunk Rotation - 2 x daily - 6 x weekly - 1 sets - 10 reps - 5 sec hold Hooklying Single Knee to Chest Stretch - 2 x daily - 6 x weekly - 1 sets - 3-4 reps - 20 hold Bent Knee Fallouts with Alternating Legs - 2 x daily - 6 x weekly - 1 sets - 10 reps Mini Squats with Walker and Chair - 2 x daily - 6 x weekly - 1 sets - 10 reps
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
## 2021-12-04 DIAGNOSIS — S42031A Displaced fracture of lateral end of right clavicle, initial encounter for closed fracture: Secondary | ICD-10-CM | POA: Diagnosis not present

## 2021-12-04 DIAGNOSIS — S42001A Fracture of unspecified part of right clavicle, initial encounter for closed fracture: Secondary | ICD-10-CM | POA: Diagnosis not present

## 2021-12-04 DIAGNOSIS — S4991XA Unspecified injury of right shoulder and upper arm, initial encounter: Secondary | ICD-10-CM | POA: Diagnosis not present

## 2021-12-08 ENCOUNTER — Encounter: Payer: BC Managed Care – PPO | Admitting: Rehabilitative and Restorative Service Providers"

## 2021-12-11 ENCOUNTER — Telehealth: Payer: Self-pay

## 2021-12-11 ENCOUNTER — Ambulatory Visit
Admission: RE | Admit: 2021-12-11 | Discharge: 2021-12-11 | Disposition: A | Discharge status: Home / Self Care | Payer: Medicare HMO | Source: Ambulatory Visit | Attending: Orthopaedic Surgery | Admitting: Orthopaedic Surgery

## 2021-12-11 ENCOUNTER — Other Ambulatory Visit: Payer: Self-pay

## 2021-12-11 DIAGNOSIS — M40204 Unspecified kyphosis, thoracic region: Secondary | ICD-10-CM | POA: Diagnosis not present

## 2021-12-11 DIAGNOSIS — M546 Pain in thoracic spine: Secondary | ICD-10-CM | POA: Diagnosis not present

## 2021-12-11 DIAGNOSIS — H8112 Benign paroxysmal vertigo, left ear: Secondary | ICD-10-CM | POA: Diagnosis not present

## 2021-12-11 DIAGNOSIS — H60312 Diffuse otitis externa, left ear: Secondary | ICD-10-CM | POA: Diagnosis not present

## 2021-12-11 DIAGNOSIS — S22000A Wedge compression fracture of unspecified thoracic vertebra, initial encounter for closed fracture: Secondary | ICD-10-CM

## 2021-12-11 DIAGNOSIS — H6982 Other specified disorders of Eustachian tube, left ear: Secondary | ICD-10-CM | POA: Diagnosis not present

## 2021-12-11 NOTE — Telephone Encounter (Signed)
Please see critical results for CT Thoracic. Per Malachy Mood with [facility].  Thank you.

## 2021-12-12 ENCOUNTER — Telehealth: Payer: Self-pay

## 2021-12-12 ENCOUNTER — Other Ambulatory Visit: Payer: Self-pay

## 2021-12-12 DIAGNOSIS — I719 Aortic aneurysm of unspecified site, without rupture: Secondary | ICD-10-CM

## 2021-12-12 NOTE — Telephone Encounter (Signed)
Noted! Thank you

## 2021-12-12 NOTE — Telephone Encounter (Signed)
Referral to vascular service-also sent to Ander Purpura

## 2021-12-12 NOTE — Telephone Encounter (Signed)
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

## 2021-12-12 NOTE — Telephone Encounter (Signed)
Needs referral to vascular service

## 2021-12-16 ENCOUNTER — Encounter: Payer: BC Managed Care – PPO | Admitting: Rehabilitative and Restorative Service Providers"

## 2021-12-17 DIAGNOSIS — H6983 Other specified disorders of Eustachian tube, bilateral: Secondary | ICD-10-CM | POA: Diagnosis not present

## 2021-12-17 DIAGNOSIS — H6121 Impacted cerumen, right ear: Secondary | ICD-10-CM | POA: Diagnosis not present

## 2021-12-18 ENCOUNTER — Other Ambulatory Visit: Payer: Self-pay

## 2021-12-18 ENCOUNTER — Encounter: Payer: BC Managed Care – PPO | Admitting: Rehabilitative and Restorative Service Providers"

## 2021-12-18 DIAGNOSIS — I719 Aortic aneurysm of unspecified site, without rupture: Secondary | ICD-10-CM

## 2021-12-23 ENCOUNTER — Encounter: Payer: BC Managed Care – PPO | Admitting: Rehabilitative and Restorative Service Providers"

## 2021-12-24 ENCOUNTER — Telehealth: Payer: Self-pay

## 2021-12-24 NOTE — Telephone Encounter (Signed)
Pt called stating she is waiting on results and was suppose to have a call back. She stated she had a scan done please advise

## 2021-12-25 ENCOUNTER — Other Ambulatory Visit: Payer: Self-pay

## 2021-12-25 ENCOUNTER — Encounter: Payer: BC Managed Care – PPO | Admitting: Rehabilitative and Restorative Service Providers"

## 2021-12-25 DIAGNOSIS — I719 Aortic aneurysm of unspecified site, without rupture: Secondary | ICD-10-CM

## 2021-12-25 NOTE — Telephone Encounter (Signed)
I contacted the patient today with the results of her CT scan of her thoracic spine.  She has 2 subacute compression fractures at T11 and T12.  She has a more acute fracture by scan at the L1 level.  With mild stenosis.  She has chronic fractures that have been noted before.  She had a lot of questions about the studies that have been ordered.  I did review with her that she had on CT of the thoracic aneurysm that measured about 4.9 cm.  I told her that we do not know if this is something new if she has had a long time but we have referred her for a vascular evaluation to see if this is needs attention or just needs to be followed by them.  She had several questions about the aneurysm and I told her that I would defer to the vascular surgeons.  Certainly if she is cleared from that point she was interested in kyphoplasty that can be revisited and referred if needed.  In the meantime she can wear her brace for comfort.

## 2021-12-25 NOTE — Telephone Encounter (Signed)
Error as above-will call with results

## 2021-12-25 NOTE — Telephone Encounter (Signed)
Please call patient with results of tests-thanks

## 2021-12-25 NOTE — Telephone Encounter (Signed)
Seen in office.

## 2021-12-26 ENCOUNTER — Telehealth: Payer: Self-pay | Admitting: Orthopaedic Surgery

## 2021-12-26 NOTE — Telephone Encounter (Signed)
Patient called needing a call back concerning where she is being sent for the test. Patient asked for a call back as soon as possible. The number to contact patient is [phone]

## 2021-12-26 NOTE — Telephone Encounter (Signed)
Pt just called wanting to know what all the angio consist of and I explained to her what all it involves and that there will be an injection of dye for the angio and pt states she is HIGHLY allergic to the contrast dye and wants to know if there is anything else that could be done.   Pt also is wanting to wait on the referral to Cardic and thoracic surgeon until she speaks with her cardiologist to see what she recommends so that she doesn't have to pay something that she may not need.   Pt wants to go ahead and do the Echo just to have it out of the way.   Message has been sent to PW to see if there is anything else that could be done since she is allergic.

## 2022-01-01 DIAGNOSIS — S42001A Fracture of unspecified part of right clavicle, initial encounter for closed fracture: Secondary | ICD-10-CM | POA: Diagnosis not present

## 2022-01-07 ENCOUNTER — Other Ambulatory Visit: Payer: Self-pay

## 2022-01-07 DIAGNOSIS — I719 Aortic aneurysm of unspecified site, without rupture: Secondary | ICD-10-CM

## 2022-01-07 NOTE — Progress Notes (Signed)
chest

## 2022-01-14 ENCOUNTER — Telehealth: Payer: Self-pay | Admitting: Orthopaedic Surgery

## 2022-01-14 ENCOUNTER — Telehealth: Payer: Self-pay | Admitting: *Deleted

## 2022-01-14 NOTE — Telephone Encounter (Signed)
Pt called and is wondering if you can refer her somewhere else that can see her earlier for her aneurism.   CB [phone]

## 2022-01-14 NOTE — Telephone Encounter (Signed)
CompletedCommunicated - talked with patient about sch her appt at [facility], need to get the CT Chest scheduled first, she has problems with caregiver/bringing her to appts/has to get walker out of house/will have to get dates from caregiver still working and will call our office back

## 2022-01-15 ENCOUNTER — Other Ambulatory Visit: Payer: BC Managed Care – PPO

## 2022-01-15 NOTE — Telephone Encounter (Signed)
Called and spoke with patient. She is going to keep with the referral we placed. They moved her appointment up to mid March of this year.

## 2022-01-15 NOTE — Telephone Encounter (Signed)
I do not have any further suggestions re referral unless to [facility]-you can try but they may not see her any sooner

## 2022-01-16 DIAGNOSIS — H6121 Impacted cerumen, right ear: Secondary | ICD-10-CM | POA: Diagnosis not present

## 2022-01-16 DIAGNOSIS — S32000S Wedge compression fracture of unspecified lumbar vertebra, sequela: Secondary | ICD-10-CM | POA: Diagnosis not present

## 2022-01-16 DIAGNOSIS — F419 Anxiety disorder, unspecified: Secondary | ICD-10-CM | POA: Diagnosis not present

## 2022-01-16 DIAGNOSIS — H9201 Otalgia, right ear: Secondary | ICD-10-CM | POA: Diagnosis not present

## 2022-01-21 ENCOUNTER — Ambulatory Visit
Admission: RE | Admit: 2022-01-21 | Discharge: 2022-01-21 | Disposition: A | Discharge status: Home / Self Care | Payer: Medicare HMO | Source: Ambulatory Visit | Attending: Orthopaedic Surgery | Admitting: Orthopaedic Surgery

## 2022-01-21 DIAGNOSIS — I7121 Aneurysm of the ascending aorta, without rupture: Secondary | ICD-10-CM | POA: Diagnosis not present

## 2022-01-21 DIAGNOSIS — I719 Aortic aneurysm of unspecified site, without rupture: Secondary | ICD-10-CM

## 2022-01-22 ENCOUNTER — Other Ambulatory Visit: Payer: Self-pay

## 2022-01-22 ENCOUNTER — Ambulatory Visit (HOSPITAL_COMMUNITY): Payer: BC Managed Care – PPO | Attending: Cardiology

## 2022-01-22 DIAGNOSIS — I080 Rheumatic disorders of both mitral and aortic valves: Secondary | ICD-10-CM | POA: Diagnosis not present

## 2022-01-22 DIAGNOSIS — I719 Aortic aneurysm of unspecified site, without rupture: Secondary | ICD-10-CM | POA: Diagnosis not present

## 2022-01-22 DIAGNOSIS — I7121 Aneurysm of the ascending aorta, without rupture: Secondary | ICD-10-CM | POA: Diagnosis not present

## 2022-01-22 DIAGNOSIS — I341 Nonrheumatic mitral (valve) prolapse: Secondary | ICD-10-CM | POA: Diagnosis not present

## 2022-01-22 LAB — ECHOCARDIOGRAM COMPLETE
Area-P 1/2: 4.8 cm2
P 1/2 time: 299 msec
S' Lateral: 2.6 cm

## 2022-02-03 DIAGNOSIS — M549 Dorsalgia, unspecified: Secondary | ICD-10-CM | POA: Diagnosis not present

## 2022-02-03 DIAGNOSIS — F419 Anxiety disorder, unspecified: Secondary | ICD-10-CM | POA: Diagnosis not present

## 2022-02-03 DIAGNOSIS — R002 Palpitations: Secondary | ICD-10-CM | POA: Diagnosis not present

## 2022-02-10 ENCOUNTER — Institutional Professional Consult (permissible substitution): Payer: Medicare HMO | Admitting: Thoracic Surgery (Cardiothoracic Vascular Surgery)

## 2022-02-10 ENCOUNTER — Other Ambulatory Visit: Payer: Self-pay

## 2022-02-10 ENCOUNTER — Encounter: Payer: Self-pay | Admitting: Thoracic Surgery (Cardiothoracic Vascular Surgery)

## 2022-02-10 VITALS — BP 150/63 | HR 70 | Resp 18 | Ht 66.0 in | Wt 110.0 lb

## 2022-02-10 DIAGNOSIS — I7121 Aneurysm of the ascending aorta, without rupture: Secondary | ICD-10-CM

## 2022-02-10 NOTE — Progress Notes (Signed)
PCP is Alisha Frees, MD ?Referring Provider is Alisha Balding, MD ? ?Chief Complaint  ?Patient presents with  ? Thoracic Aortic Aneurysm  ?  Surgical consult, Chest CT 01/21/22, ECHO 01/22/22  ? ? ?HPI: Alisha Henderson is sent for consultation regarding an ascending aneurysm. ? ?Alisha Henderson is a 71 year old woman with a past medical history significant for degenerative thoracic and lumbar disc disease, kyphoscoliosis, intractable back pain, irritable bowel syndrome, mitral valve prolapse, blindness left eye, Mycobacterium abscessus infection, and severe protein calorie malnutrition.  She was being evaluated by Alisha Henderson for her back issues.  A CT of the thoracic spine revealed an ascending aneurysm. ? ?She then underwent a formal CT of the chest which showed a 4.9 cm ascending aneurysm tapering back to normal before the aortic arch.  An echocardiogram showed moderate AI with preserved left ventricular function. ? ?She is not having any chest pain, pressure, or tightness.  She not having any shortness of breath.  She has been on propanolol for many years after being diagnosed with mitral valve prolapse in her 98s.  She has never been diagnosed with hypertension. ? ?Zubrod Score: ?At the time of surgery this patient?s most appropriate activity status/level should be described as: ?'[]'$     0    Normal activity, no symptoms ?'[x]'$     1    Restricted in physical strenuous activity but ambulatory, able to do out light work ?'[]'$     2    Ambulatory and capable of self care, unable to do work activities, up and about >50 % of waking hours                              ?'[]'$     3    Only limited self care, in bed greater than 50% of waking hours ?'[]'$     4    Completely disabled, no self care, confined to bed or chair ?'[]'$     5    Moribund ? ? ?Patient Active Problem List  ? Diagnosis Date Noted  ? Muscle spasm of back   ? Protein-calorie malnutrition, severe 09/22/2021  ? Intractable back pain 09/21/2021  ? Blindness of  left eye 09/21/2021  ? Low back pain 09/24/2020  ? Neck pain, acute 09/24/2020  ? Endophthalmitis, acute, left 01/12/2020  ? 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 Anxiety 01/12/2020  ? IBS (irritable bowel syndrome) 01/12/2020  ? Mitral valve prolapse 01/12/2020  ? S/P cataract extraction and insertion of intraocular lens, left 01/12/2020  ? Multilevel degenerative disc disease 01/12/2020  ?  ? ?History reviewed. No pertinent surgical history. ? ?History reviewed. No pertinent family history. ? ?Social History ?Social History  ? ?Tobacco Use  ? Smoking status: Never  ? Smokeless tobacco: Never  ?Substance Use Topics  ? Alcohol use: No  ? Drug use: No  ? ? ?Current Outpatient Medications  ?Medication Sig Dispense Refill  ? acetaminophen (TYLENOL) 325 MG tablet Take 2 tablets (650 mg total) by mouth every 6 (six) hours as needed for mild pain (or Fever >/= 101).    ? Calcium Carbonate-Vitamin D (CALCIUM + D PO) Take 1 tablet by mouth daily.    ? carboxymethylcellulose (REFRESH PLUS) 0.5 % SOLN Place 1 drop into both eyes 5 (five) times daily.    ? LORazepam (ATIVAN) 1 MG tablet Take 1 tablet (1 mg total) by mouth every 8 (eight) hours. 6 tablet 0  ? methocarbamol (ROBAXIN)  500 MG tablet Take 1 tablet (500 mg total) by mouth 4 (four) times daily. 30 tablet 0  ? Multiple Vitamin (DAILY VITAMINS PO) Take 15 mLs by mouth daily.    ? oxyCODONE (OXY IR/ROXICODONE) 5 MG immediate release tablet Take 1 tablet (5 mg total) by mouth every 4 (four) hours as needed for moderate pain or severe pain. 30 tablet 0  ? polyethylene glycol (MIRALAX / GLYCOLAX) 17 g packet Take 17 g by mouth daily as needed for mild constipation. 14 each 0  ? propranolol (INDERAL) 10 MG tablet Take 10 mg by mouth 3 (three) times daily. Takes 10 mg first two doses and then 5 mg last dose.    ? ?No current facility-administered medications for this visit.  ? ? ?Allergies  ?Allergen Reactions  ? Cephalosporins Anaphylaxis  ? Sulfa Antibiotics  Anaphylaxis  ? Sulfites Anaphylaxis  ? Cephalexin Itching  ? Ciprofloxacin Other (See Comments)  ?  Ruptured Achilles tendon  ? Codeine Nausea And Vomiting  ? Other   ?  Antibiotics cause anaphylaxis  ? Penicillins   ?  Has patient had a PCN reaction causing immediate rash, facial/tongue/throat swelling, SOB or lightheadedness with hypotension: Yes- Anaphylaxis  ?Has patient had a PCN reaction causing severe rash involving mucus membranes or skin necrosis: No ?Has patient had a PCN reaction that required hospitalization No ?Has patient had a PCN reaction occurring within the last 10 years: No ?If all of the above answers are "NO", then may proceed with Cephalosporin use. ?  ? ? ?Review of Systems  ?Constitutional:  Positive for activity change and appetite change.  ?Eyes:  Positive for visual disturbance (Blind in left eye).  ?Respiratory:  Negative for shortness of breath.   ?Cardiovascular:  Negative for chest pain and leg swelling.  ?Musculoskeletal:  Positive for back pain and gait problem (ambulates with walker).  ?Skin:  Positive for rash (eczema).  ? ?BP (!) 150/63   Pulse 70   Resp 18   Ht '5\' 6"'$  (1.676 m)   Wt 110 lb (49.9 kg)   SpO2 99% Comment: RA  BMI 17.75 kg/m?  ?Physical Exam ?Vitals reviewed.  ?Constitutional:   ?   General: She is not in acute distress. ?   Comments: Cachectic  ?HENT:  ?   Head: Normocephalic and atraumatic.  ?Eyes:  ?   General: No scleral icterus. ?   Extraocular Movements: Extraocular movements intact.  ?Neck:  ?   Vascular: No carotid bruit.  ?Cardiovascular:  ?   Rate and Rhythm: Normal rate and regular rhythm.  ?   Pulses: Normal pulses.  ?   Heart sounds: Murmur (2/6 diastolic right upper sternal border) heard.  ?Pulmonary:  ?   Effort: Pulmonary effort is normal. No respiratory distress.  ?   Breath sounds: Normal breath sounds. No wheezing.  ?Musculoskeletal:     ?   General: Deformity (Severe kyphoscoliosis, sternal deformity) present.  ?   Cervical back: Neck supple.   ?Skin: ?   General: Skin is warm and dry.  ?Neurological:  ?   General: No focal deficit present.  ?   Mental Status: She is alert and oriented to person, place, and time.  ?   Cranial Nerves: No cranial nerve deficit.  ? ? ?Diagnostic Tests: ?CT CHEST WITHOUT CONTRAST ?  ?TECHNIQUE: ?Multidetector CT imaging of the chest was performed following the ?standard protocol without IV contrast. ?  ?Patient declined IV contrast. ?  ?RADIATION DOSE REDUCTION: This exam  was performed according to the ?departmental dose-optimization program which includes automated ?exposure control, adjustment of the mA and/or kV according to ?patient size and/or use of iterative reconstruction technique. ?  ?COMPARISON:  12/11/2021 and previous ?  ?FINDINGS: ?Cardiovascular: Heart size upper limits normal. No pericardial ?effusion. Central pulmonary arteries normal in caliber. Limited ?assessment of the aortic root in the absence of intravenous ?contrast. Ascending thoracic aorta measures up to 4.9 cm diameter, ?aorta arch 3.7 cm, proximal descending segment 3.7 cm, tapering to ?3.1 cm above the diaphragm. Lack of IV contrast limits assessment ?for dissection, penetrating atheromatous ulcer, and other wall ?abnormalities. Mild scattered calcified atheromatous plaque in the ?arch, descending thoracic and visualized proximal abdominal aorta. ?  ?Mediastinum/Nodes: No mediastinal hematoma, mass or adenopathy. ?  ?Lungs/Pleura: No pleural effusion. No pneumothorax. Linear ?subsegmental atelectasis or scarring in the lung bases left greater ?than right, stable. 5 mm pleural-based nodule, superior segment left ?upper lobe (Im65,Se6) . mild pleuroparenchymal scarring in the lung ?apices. ?  ?Upper Abdomen: No acute findings. ?  ?Musculoskeletal: Stable compression deformity of T3. Subacute ?unhealed compression fractures of T10, T11, and L1 as before, with ?at least 5 mm retropulsion at T11 resulting in a degree of spinal ?stenosis. Superior  endplate compression deformities of L2, L3, and ?L4 of indeterminate chronicity. ?  ?IMPRESSION: ?1. 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 Incomplete ?characterization in the absence of IV contrast. Recommend

## 2022-02-23 DIAGNOSIS — R002 Palpitations: Secondary | ICD-10-CM | POA: Diagnosis not present

## 2022-02-23 DIAGNOSIS — M8000XS Age-related osteoporosis with current pathological fracture, unspecified site, sequela: Secondary | ICD-10-CM | POA: Diagnosis not present

## 2022-02-23 DIAGNOSIS — E46 Unspecified protein-calorie malnutrition: Secondary | ICD-10-CM | POA: Diagnosis not present

## 2022-02-23 DIAGNOSIS — F419 Anxiety disorder, unspecified: Secondary | ICD-10-CM | POA: Diagnosis not present

## 2022-02-23 DIAGNOSIS — M549 Dorsalgia, unspecified: Secondary | ICD-10-CM | POA: Diagnosis not present

## 2022-03-04 DIAGNOSIS — S22080A Wedge compression fracture of T11-T12 vertebra, initial encounter for closed fracture: Secondary | ICD-10-CM | POA: Diagnosis not present

## 2022-03-04 DIAGNOSIS — M810 Age-related osteoporosis without current pathological fracture: Secondary | ICD-10-CM | POA: Diagnosis not present

## 2022-03-04 DIAGNOSIS — S22070A Wedge compression fracture of T9-T10 vertebra, initial encounter for closed fracture: Secondary | ICD-10-CM | POA: Diagnosis not present

## 2022-03-04 DIAGNOSIS — S32010A Wedge compression fracture of first lumbar vertebra, initial encounter for closed fracture: Secondary | ICD-10-CM | POA: Diagnosis not present

## 2022-03-04 DIAGNOSIS — M545 Low back pain, unspecified: Secondary | ICD-10-CM | POA: Diagnosis not present
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
## 2022-03-09 ENCOUNTER — Encounter: Payer: Self-pay | Admitting: Interventional Cardiology

## 2022-03-09 ENCOUNTER — Ambulatory Visit: Payer: Medicare HMO | Admitting: Interventional Cardiology

## 2022-03-09 VITALS — BP 142/72 | HR 64 | Ht 66.0 in | Wt 103.6 lb

## 2022-03-09 DIAGNOSIS — I351 Nonrheumatic aortic (valve) insufficiency: Secondary | ICD-10-CM | POA: Diagnosis not present

## 2022-03-09 DIAGNOSIS — I7121 Aneurysm of the ascending aorta, without rupture: Secondary | ICD-10-CM | POA: Diagnosis not present

## 2022-03-09 DIAGNOSIS — I341 Nonrheumatic mitral (valve) prolapse: Secondary | ICD-10-CM

## 2022-03-09 NOTE — Patient Instructions (Addendum)
Medication Instructions:  ?Your physician recommends that you continue on your current medications as directed. Please refer to the Current Medication list given to you today. ? ?*If you need a refill on your cardiac medications before your next appointment, please call your pharmacy* ? ? ?Lab Work: ?None ?If you have labs (blood work) drawn today and your tests are completely normal, you will receive your results only by: ?MyChart Message (if you have MyChart) OR ?A paper copy in the mail ?If you have any lab test that is abnormal or we need to change your treatment, we will call you to review the results. ? ? ?Testing/Procedures: ?Your physician has requested that you have an echocardiogram 1-2 weeks prior to seeing Dr. Tamala Julian back in one year. Echocardiography is a painless test that uses sound waves to create images of your heart. It provides your doctor with information about the size and shape of your heart and how well your heart?s chambers and valves are working. This procedure takes approximately one hour. There are no restrictions for this procedure. ? ?Your physician has requested that you have cardiac CT in August. Cardiac computed tomography (CT) is a painless test that uses an x-ray machine to take clear, detailed pictures of your heart. For further information please visit [email or web address]. Please follow instruction sheet as given. ? ? ? ?Follow-Up: ?At [facility], you and your health needs are our priority.  As part of our continuing mission to provide you with exceptional heart care, we have created designated Provider Care Teams.  These Care Teams include your primary Cardiologist (physician) and Advanced Practice Providers (APPs -  Physician Assistants and Nurse Practitioners) who all work together to provide you with the care you need, when you need it. ? ?We recommend signing up for the patient portal called "MyChart".  Sign up information is provided on this After Visit Summary.  MyChart  is used to connect with patients for Virtual Visits (Telemedicine).  Patients are able to view lab/test results, encounter notes, upcoming appointments, etc.  Non-urgent messages can be sent to your provider as well.   ?To learn more about what you can do with MyChart, go to [email or web address].   ? ?Your next appointment:   ?1 year(s) ? ?The format for your next appointment:   ?In Person ? ?Provider:   ?[facility]. Blenda Bridegroom, MD  ? ? ?Other Instructions ? ? ?Important Information About [employer] ? ? ? ? ?  ?

## 2022-03-13 DIAGNOSIS — U071 COVID-19: Secondary | ICD-10-CM | POA: Diagnosis not present

## 2022-04-03 ENCOUNTER — Telehealth: Payer: Self-pay

## 2022-04-03 NOTE — Telephone Encounter (Signed)
Spoke with patient regarding CT scan. ? ?Note from [employer] message: Alisha Henderson, Alisha Henderson ?[place] [employer]. Spoke with patient and she stated she didn't want contrast when she have the test done.   Wanted them to call MD to discuss.  Please call patient to discuss.    ? ?Per Dr. Tamala Julian: can have CT without contrast but images would not be as clear. ? ?Discussed the above with patient and she states she would like to proceed without contrast. Patient does not want contrast due to concern with anaphylaxis (sensitive to many medications). ? ?Will forward to Dr. Tamala Julian to clarify if OK to proceed with CT without contrast in 6 months. ?

## 2022-04-09 ENCOUNTER — Other Ambulatory Visit: Payer: Self-pay | Admitting: Interventional Cardiology

## 2022-04-09 DIAGNOSIS — I7121 Aneurysm of the ascending aorta, without rupture: Secondary | ICD-10-CM

## 2022-04-09 NOTE — Progress Notes (Signed)
New order for CT Angio Chest/Aorta WITHOUT contrast media ordered per Dr. Tamala Julian. ?

## 2022-04-13 ENCOUNTER — Other Ambulatory Visit: Payer: Self-pay | Admitting: Interventional Cardiology

## 2022-04-13 DIAGNOSIS — I7121 Aneurysm of the ascending aorta, without rupture: Secondary | ICD-10-CM

## 2022-04-13 NOTE — Progress Notes (Signed)
CT chest without contrast for aortic aneurysm. ?

## 2022-04-21 ENCOUNTER — Other Ambulatory Visit: Payer: Medicare HMO

## 2022-05-12 ENCOUNTER — Ambulatory Visit
Admission: RE | Admit: 2022-05-12 | Discharge: 2022-05-12 | Disposition: A | Discharge status: Home / Self Care | Payer: Medicare HMO | Source: Ambulatory Visit | Attending: Family Medicine | Admitting: Family Medicine

## 2022-05-12 ENCOUNTER — Other Ambulatory Visit: Payer: Self-pay | Admitting: Family Medicine

## 2022-05-12 DIAGNOSIS — R600 Localized edema: Secondary | ICD-10-CM

## 2022-05-12 DIAGNOSIS — R030 Elevated blood-pressure reading, without diagnosis of hypertension: Secondary | ICD-10-CM | POA: Diagnosis not present

## 2022-05-18 DIAGNOSIS — F419 Anxiety disorder, unspecified: Secondary | ICD-10-CM | POA: Diagnosis not present

## 2022-05-18 DIAGNOSIS — L309 Dermatitis, unspecified: Secondary | ICD-10-CM | POA: Diagnosis not present

## 2022-05-18 DIAGNOSIS — M545 Low back pain, unspecified: Secondary | ICD-10-CM | POA: Diagnosis not present

## 2022-05-18 DIAGNOSIS — I341 Nonrheumatic mitral (valve) prolapse: Secondary | ICD-10-CM | POA: Diagnosis not present

## 2022-05-18 DIAGNOSIS — E7800 Pure hypercholesterolemia, unspecified: Secondary | ICD-10-CM | POA: Diagnosis not present

## 2022-05-18 DIAGNOSIS — R002 Palpitations: Secondary | ICD-10-CM | POA: Diagnosis not present

## 2022-06-18 DIAGNOSIS — N390 Urinary tract infection, site not specified: Secondary | ICD-10-CM | POA: Diagnosis not present

## 2022-07-06 ENCOUNTER — Other Ambulatory Visit: Payer: Self-pay | Admitting: Thoracic Surgery (Cardiothoracic Vascular Surgery)

## 2022-07-06 DIAGNOSIS — I7121 Aneurysm of the ascending aorta, without rupture: Secondary | ICD-10-CM

## 2022-07-07 ENCOUNTER — Telehealth: Payer: Self-pay | Admitting: Interventional Cardiology

## 2022-07-07 NOTE — Telephone Encounter (Signed)
Pt called stating she was due for a fu now, however I informed her that her recall and last appt notes states Dr. Tamala Julian wanted to f/u in 1 year and his schedule isn't open for that month yet. She states she is also supposed to have a routine CT, however she is not sure whether she should schedule now or wait until closer to her year f/u. Please advise.

## 2022-07-07 NOTE — Telephone Encounter (Signed)
Returned call to patient, CT scan without contrast for follow-up on aneurysm due in October 2023.  Will forward to scheduling team to contact patient to schedule and appointment.

## 2022-07-10 DIAGNOSIS — R399 Unspecified symptoms and signs involving the genitourinary system: Secondary | ICD-10-CM | POA: Diagnosis not present

## 2022-07-10 DIAGNOSIS — I872 Venous insufficiency (chronic) (peripheral): Secondary | ICD-10-CM | POA: Diagnosis not present

## 2022-07-10 DIAGNOSIS — R8279 Other abnormal findings on microbiological examination of urine: Secondary | ICD-10-CM | POA: Diagnosis not present

## 2022-08-05 DIAGNOSIS — I8310 Varicose veins of unspecified lower extremity with inflammation: Secondary | ICD-10-CM | POA: Diagnosis not present

## 2022-08-05 DIAGNOSIS — M810 Age-related osteoporosis without current pathological fracture: Secondary | ICD-10-CM | POA: Diagnosis not present

## 2022-08-05 DIAGNOSIS — S32010A Wedge compression fracture of first lumbar vertebra, initial encounter for closed fracture: Secondary | ICD-10-CM | POA: Diagnosis not present

## 2022-08-05 DIAGNOSIS — E46 Unspecified protein-calorie malnutrition: Secondary | ICD-10-CM | POA: Diagnosis not present

## 2022-08-05 DIAGNOSIS — M549 Dorsalgia, unspecified: Secondary | ICD-10-CM | POA: Diagnosis not present

## 2022-08-05 DIAGNOSIS — F419 Anxiety disorder, unspecified: Secondary | ICD-10-CM | POA: Diagnosis not present

## 2022-08-05 DIAGNOSIS — M545 Low back pain, unspecified: Secondary | ICD-10-CM | POA: Diagnosis not present

## 2022-08-05 DIAGNOSIS — E7800 Pure hypercholesterolemia, unspecified: Secondary | ICD-10-CM | POA: Diagnosis not present

## 2022-08-05 DIAGNOSIS — R002 Palpitations: Secondary | ICD-10-CM | POA: Diagnosis not present

## 2022-08-05 DIAGNOSIS — I872 Venous insufficiency (chronic) (peripheral): Secondary | ICD-10-CM | POA: Diagnosis not present

## 2022-08-25 ENCOUNTER — Ambulatory Visit: Payer: Medicare HMO | Admitting: Thoracic Surgery (Cardiothoracic Vascular Surgery)

## 2022-08-27 NOTE — Telephone Encounter (Signed)
Opened in error

## 2022-08-31 DIAGNOSIS — H01001 Unspecified blepharitis right upper eyelid: Secondary | ICD-10-CM | POA: Diagnosis not present

## 2022-08-31 DIAGNOSIS — H01004 Unspecified blepharitis left upper eyelid: Secondary | ICD-10-CM | POA: Diagnosis not present

## 2022-09-11 ENCOUNTER — Telehealth: Payer: Self-pay | Admitting: Interventional Cardiology

## 2022-09-11 ENCOUNTER — Inpatient Hospital Stay: Admission: RE | Admit: 2022-09-11 | Payer: Medicare HMO | Source: Ambulatory Visit

## 2022-09-11 DIAGNOSIS — I7121 Aneurysm of the ascending aorta, without rupture: Secondary | ICD-10-CM

## 2022-09-11 NOTE — Telephone Encounter (Signed)
Patient called stating she would like to cancel test on 10/24.  Patient stated she would like to have this test done at [facility].

## 2022-09-11 NOTE — Telephone Encounter (Signed)
Returned call to patient.  She states she would like to cancel current appt for Chest CT w/o contrast and have it performed at [place] ([facility]  CT appt for 09/22/22 cancelled and CT chest w/o contrast ordered at [place] location.  Patient verbalized understanding and expressed appreciation for call.

## 2022-09-14 ENCOUNTER — Ambulatory Visit (HOSPITAL_COMMUNITY): Payer: Medicare HMO

## 2022-09-14 ENCOUNTER — Ambulatory Visit: Payer: Medicare HMO

## 2022-09-22 ENCOUNTER — Ambulatory Visit: Payer: Medicare HMO

## 2022-09-30 ENCOUNTER — Other Ambulatory Visit: Payer: Medicare HMO

## 2022-10-21 ENCOUNTER — Ambulatory Visit
Admission: RE | Admit: 2022-10-21 | Discharge: 2022-10-21 | Disposition: A | Discharge status: Home / Self Care | Payer: Medicare HMO | Source: Ambulatory Visit | Attending: Interventional Cardiology | Admitting: Interventional Cardiology

## 2022-10-21 DIAGNOSIS — R911 Solitary pulmonary nodule: Secondary | ICD-10-CM | POA: Diagnosis not present

## 2022-10-21 DIAGNOSIS — I7121 Aneurysm of the ascending aorta, without rupture: Secondary | ICD-10-CM

## 2022-10-28 ENCOUNTER — Telehealth: Payer: Self-pay | Admitting: Interventional Cardiology

## 2022-10-28 DIAGNOSIS — I7121 Aneurysm of the ascending aorta, without rupture: Secondary | ICD-10-CM

## 2022-10-28 NOTE — Telephone Encounter (Signed)
Spoke with patient and discussed results of CT scan.  Per Dr. Tamala Julian: Let the patient know the aorta is now measuring 5 cm in diameter. Needs consult with CT surgery ASAP. AOrtic MRI in 6 months   Patient states she has already seen Dr. Roxan Hockey and was told that as long as her aorta remains stable at 5cm then nothing else needs to be done. Patient states she was not happy with her experience at Dr. Leonarda Salon office and requests a provider at another office if she still needs to follow up with cardiothoracic consult.  Patient is also concerned because she repeatedly states she was told from prior CT scan in February 2023 that her aneurysm already measured at 5cm, but upon review it appears the CT report states 4.9cm.  Patient would like to know if this is really significant enough for her to see another cardiothoracic surgeon to discuss, also if the shape of the aneurysm (fusiform) is less likely to rupture.  Patient agreeable to follow-up MRI scan in 6 months, order placed, scheduler to call and setup  apt with patient.  Will forward to Dr. Tamala Julian to review.

## 2022-10-28 NOTE — Telephone Encounter (Signed)
-----   Message from Belva Crome, MD sent at 10/25/2022  4:20 PM EST ----- Let the patient know the aorta is now measuring 5 cm in diameter. Needs consult with CT surgery ASAP. AOrtic MRI in 6 months A copy will be sent to Stacie Glaze, DO

## 2022-11-05 DIAGNOSIS — R002 Palpitations: Secondary | ICD-10-CM | POA: Diagnosis not present

## 2022-11-05 DIAGNOSIS — I8310 Varicose veins of unspecified lower extremity with inflammation: Secondary | ICD-10-CM | POA: Diagnosis not present

## 2022-11-05 DIAGNOSIS — F419 Anxiety disorder, unspecified: Secondary | ICD-10-CM | POA: Diagnosis not present

## 2022-11-05 DIAGNOSIS — K5909 Other constipation: Secondary | ICD-10-CM | POA: Diagnosis not present

## 2022-11-05 DIAGNOSIS — E7800 Pure hypercholesterolemia, unspecified: Secondary | ICD-10-CM | POA: Diagnosis not present

## 2022-11-05 DIAGNOSIS — M549 Dorsalgia, unspecified: Secondary | ICD-10-CM | POA: Diagnosis not present

## 2022-11-05 DIAGNOSIS — E46 Unspecified protein-calorie malnutrition: Secondary | ICD-10-CM | POA: Diagnosis not present

## 2022-11-05 DIAGNOSIS — Z8781 Personal history of (healed) traumatic fracture: Secondary | ICD-10-CM | POA: Diagnosis not present

## 2022-11-10 ENCOUNTER — Ambulatory Visit: Payer: Medicare HMO | Admitting: Thoracic Surgery (Cardiothoracic Vascular Surgery)

## 2022-11-16 DIAGNOSIS — I7121 Aneurysm of the ascending aorta, without rupture: Secondary | ICD-10-CM

## 2022-11-16 NOTE — Telephone Encounter (Signed)
Sent recommendations to patient via MyChart message.

## 2022-12-16 ENCOUNTER — Encounter: Payer: Self-pay | Admitting: Surgery

## 2022-12-16 ENCOUNTER — Other Ambulatory Visit: Payer: Self-pay | Admitting: Surgery

## 2022-12-16 ENCOUNTER — Institutional Professional Consult (permissible substitution): Payer: Medicare HMO | Admitting: Surgery

## 2022-12-16 VITALS — BP 138/54 | HR 71 | Resp 20 | Ht 66.0 in | Wt 106.0 lb

## 2022-12-16 DIAGNOSIS — I7121 Aneurysm of the ascending aorta, without rupture: Secondary | ICD-10-CM | POA: Diagnosis not present

## 2022-12-16 NOTE — Progress Notes (Signed)
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  She was seen by Dr. Roxan Hockey in our office on 02/10/2022 and then followed up by Dr. Daneen Schick.  She wished to return to see me for a second opinion.  There is no family history of aortic aneurysm or aortic dissection or connective tissue disorder.  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   Past Medical History:  Diagnosis Date   Aneurysm of ascending aorta (HCC)    Anxiety    Degenerative disc disease, lumbar    Mitral valve prolapse    Palpitations    Psoriasis     History reviewed. No pertinent surgical history.  History reviewed. No pertinent family history.  Social History Social History   Tobacco Use   Smoking status: Never   Smokeless tobacco: Never  Substance Use Topics   Alcohol use: No   Drug use: No    Current Outpatient Medications  Medication Sig Dispense Refill   Calcium Carbonate-Vitamin D (CALCIUM + D PO) Take 1 tablet by mouth daily.     carboxymethylcellulose (REFRESH PLUS) 0.5 % SOLN Place 1 drop into both eyes 5 (five) times daily.     LORazepam (ATIVAN) 1 MG tablet Take 1 tablet (1 mg total) by mouth every 8 (eight) hours. 6 tablet 0   Multiple Vitamin (DAILY VITAMINS PO) Take 15 mLs by mouth daily.     polyethylene glycol (MIRALAX / GLYCOLAX) 17 g packet Take 17 g by mouth daily as needed for mild constipation. 14 each 0   propranolol (INDERAL) 10 MG tablet  Take 10 mg by mouth 3 (three) times daily. Takes 10 mg first two doses and then 5 mg last dose.     No current facility-administered medications for this visit.    Allergies  Allergen Reactions   Cephalosporins Anaphylaxis   Sulfa Antibiotics Anaphylaxis   Sulfites Anaphylaxis   Acetaminophen     Other reaction(s): liver pain   Celery Oil     Other reaction(s): Unknown   Cephalexin Itching   Ciprofloxacin Other (See Comments)    Ruptured Achilles tendon   Clonazepam     Other reaction(s): headache   Codeine Nausea And Vomiting   Epinephrine     Other reaction(s): palpitations   Lidocaine     Other reaction(s): Unknown   Nitrofurantoin     Other reaction(s): neuropathy   Other     Antibiotics cause anaphylaxis   Penicillins     Has patient had a PCN reaction causing immediate rash, facial/tongue/throat swelling, SOB or lightheadedness with hypotension: Yes- Anaphylaxis  Has patient had a PCN reaction causing severe rash involving mucus membranes or skin necrosis: No Has patient had a PCN reaction that required hospitalization No Has patient had a PCN reaction occurring within the last 10 years: No If all of the above answers are "NO", then may proceed with Cephalosporin use.    Sulfamethoxazole-Trimethoprim     Other reaction(s):  Unknown    Review of Systems  Constitutional:  Negative for fatigue.  HENT: Negative.    Eyes:        Blind in left eye.  Normal vision right eye.  Respiratory:  Negative for shortness of breath.   Cardiovascular:  Negative for chest pain and leg swelling.  Gastrointestinal: Negative.   Musculoskeletal:  Positive for back pain.  Neurological:  Negative for dizziness and syncope.  Hematological: Negative.     BP (!) 138/54 (BP Location: Left Arm, Patient Position: Sitting)   Pulse 71   Resp 20   Ht '5\' 6"'$  (1.676 m)   Wt 106 lb (48.1 kg)   SpO2 99%   BMI 17.11 kg/m  Physical Exam Constitutional:      Comments: Frail appearing woman  in no distress  Walks with a cane  Musculoskeletal:     Comments: Marked kyphoscoliosis  Neurological:     General: No focal deficit present.     Mental Status: She is alert and oriented to person, place, and time.  Psychiatric:        Mood and Affect: Mood normal.        Behavior: Behavior normal.      Diagnostic Tests:  Narrative & Impression  CLINICAL DATA:  Follow-up thoracic aortic aneurysm.   EXAM: CT CHEST WITHOUT CONTRAST   TECHNIQUE: Multidetector CT imaging of the chest was performed following the standard protocol without IV contrast.   RADIATION DOSE REDUCTION: This exam was performed according to the departmental dose-optimization program which includes automated exposure control, adjustment of the mA and/or kV according to patient size and/or use of iterative reconstruction technique.   COMPARISON:  Chest CT 01/21/2022   FINDINGS: Cardiovascular: The heart is enlarged but stable. No pericardial effusion. Fusiform aneurysmal dilatation of the ascending thoracic aorta with maximum measurement of 5 cm. Ascending thoracic aortic aneurysm. Recommend semi-annual imaging followup by CTA or MRA and referral to cardiothoracic surgery if not already obtained. This recommendation follows 2010 ACCF/AHA/AATS/ACR/ASA/SCA/SCAI/SIR/STS/SVM Guidelines for the Diagnosis and Management of Patients With Thoracic Aortic Disease. Circulation. 2010; 121: R678-L381. Aortic aneurysm NOS (ICD10-I71.9). Stable mild ectasia of the descending thoracic aorta with maximum diameter 3.7 cm. Stable atherosclerotic calcifications. No significant coronary artery calcifications.   Mediastinum/Nodes: No mediastinal or hilar mass or lymphadenopathy. The esophagus is grossly normal.   Lungs/Pleura: Stable chronic lung changes with biapical pleural and parenchymal scarring. Smoothly marginated and well circumscribed nodule in the superior segment of the left lower lobe appears to be rim  calcified and is consistent with a benign process. No worrisome pulmonary lesions or pulmonary nodules. No pleural effusions or pleural nodules.   Upper Abdomen: No significant upper abdominal findings.   Musculoskeletal: Markedly exaggerated thoracic kyphosis with numerous thoracic spine fractures. Most of these appears stable. Vertebral plana deformity of T11 with stable mild retropulsion. The L1 fracture is stable. Stable T10 fracture. New compression fracture the T8. No retropulsion. Remote sternal fracture.   IMPRESSION: 1. Fusiform aneurysmal dilatation of the ascending thoracic aorta with maximum measurement of 5 cm. Recommend semi-annual imaging followup by CTA or MRA and referral to cardiothoracic surgery if not already obtained. 2. Stable chronic lung changes with biapical pleural and parenchymal scarring. No worrisome pulmonary lesions. 3. Thoracic spine fractures as above.     Electronically Signed   By: Marijo Sanes M.D.   On: 10/23/2022 16:08      ECHOCARDIOGRAM REPORT       Patient Name:   HANIYYAH SAKUMA  Date of Exam: 01/22/2022  Medical Rec #:  [id number]         Height:       66.0 in  Accession #:    [id number]        Weight:       103.0 lb  Date of Birth:  12-12-1950          BSA:          1.508 m  Patient Age:    50 years          BP:           158/56 mmHg  Patient Gender: F                 HR:           80 bpm.  Exam Location:  [place]   Procedure: 2D Echo, Cardiac Doppler and Color Doppler   Indications:    I71.9 Aortic aneurysm without rupture    History:        Patient has no prior history of Echocardiogram  examinations.                 Mitral Valve Prolapse.    Sonographer:    Marygrace Drought RCS  Referring Phys: [place]     1. Left ventricular ejection fraction, by estimation, is 60 to 65%. The  left ventricle has normal function. The left ventricle has no regional  wall motion abnormalities. There is  mild concentric left ventricular  hypertrophy. Left ventricular diastolic  parameters are indeterminate.   2. Right ventricular systolic function is normal. The right ventricular  size is normal. There is normal pulmonary artery systolic pressure.   3. Left atrial size was mildly dilated.   4. Right atrial size was mildly dilated.   5. The mitral valve is normal in structure. Mild mitral valve  regurgitation. No evidence of mitral stenosis. There is mild prolapse of  posterior leaflet of the mitral valve.   6. The aortic valve is tricuspid. Aortic valve regurgitation is moderate.  No aortic stenosis is present. Aortic regurgitation PHT measures 299 msec.   7. Aneurysm of the ascending aorta, measuring 43 mm.   8. The inferior vena cava is normal in size with greater than 50%  respiratory variability, suggesting right atrial pressure of 3 mmHg.   Comparison(s): No prior Echocardiogram.   FINDINGS   Left Ventricle: Left ventricular ejection fraction, by estimation, is 60  to 65%. The left ventricle has normal function. The left ventricle has no  regional wall motion abnormalities. The left ventricular internal cavity  size was normal in size. There is   mild concentric left ventricular hypertrophy. Left ventricular diastolic  parameters are indeterminate.   Right Ventricle: The right ventricular size is normal. No increase in  right ventricular wall thickness. Right ventricular systolic function is  normal. There is normal pulmonary artery systolic pressure. The tricuspid  regurgitant velocity is 2.29 m/s, and   with an assumed right atrial pressure of 3 mmHg, the estimated right  ventricular systolic pressure is 58.3 mmHg.   Left Atrium: Left atrial size was mildly dilated.   Right Atrium: Right atrial size was mildly dilated.   Pericardium: There is no evidence of pericardial effusion.   Mitral Valve: The mitral valve is normal in structure. There is mild  prolapse of  posterior leaflet of the mitral valve. There is mild  thickening of the mitral valve  leaflet(s). Mild mitral valve  regurgitation. No evidence of mitral valve stenosis.   Tricuspid Valve: The tricuspid valve is normal in structure. Tricuspid  valve regurgitation is mild . No evidence of tricuspid stenosis.   Aortic Valve: The aortic valve is tricuspid. Aortic valve regurgitation is  moderate. Aortic regurgitation PHT measures 299 msec. No aortic stenosis  is present.   Pulmonic Valve: The pulmonic valve was normal in structure. Pulmonic valve  regurgitation is not visualized. No evidence of pulmonic stenosis.   Aorta: The aortic root is normal in size and structure. There is an  aneurysm involving the ascending aorta measuring 43 mm.   Venous: The inferior vena cava is normal in size with greater than 50%  respiratory variability, suggesting right atrial pressure of 3 mmHg.   IAS/Shunts: No atrial level shunt detected by color flow Doppler.     LEFT VENTRICLE  PLAX 2D  LVIDd:         3.70 cm   Diastology  LVIDs:         2.60 cm   LV e' medial:    9.68 cm/s  LV PW:         1.00 cm   LV E/e' medial:  6.1  LV IVS:        1.30 cm   LV e' lateral:   9.03 cm/s  LVOT diam:     1.60 cm   LV E/e' lateral: 6.6  LV SV:         45  LV SV Index:   30  LVOT Area:     2.01 cm     RIGHT VENTRICLE  RV Basal diam:  4.10 cm  RV Mid diam:    3.10 cm  RV S prime:     18.30 cm/s  TAPSE (M-mode): 3.2 cm  RVSP:           24.0 mmHg   LEFT ATRIUM             Index        RIGHT ATRIUM           Index  LA diam:        3.50 cm 2.32 cm/m   RA Pressure: 3.00 mmHg  LA Vol (A2C):   64.8 ml 42.96 ml/m  RA Area:     18.80 cm  LA Vol (A4C):   32.7 ml 21.68 ml/m  RA Volume:   55.20 ml  36.60 ml/m  LA Biplane Vol: 54.8 ml 36.33 ml/m   AORTIC VALVE  LVOT Vmax:   96.60 cm/s  LVOT Vmean:  69.200 cm/s  LVOT VTI:    0.224 m  AI PHT:      299 msec    AORTA  Ao Root diam: 3.60 cm  Ao Asc diam:  4.30  cm   MITRAL VALVE               TRICUSPID VALVE  MV Area (PHT):             TR Peak grad:   21.0 mmHg  MV Decel Time:             TR Vmax:        229.00 cm/s  MV E velocity: 59.20 cm/s  Estimated RAP:  3.00 mmHg  MV A velocity: 47.20 cm/s  RVSP:           24.0 mmHg  MV E/A ratio:  1.25  SHUNTS                             Systemic VTI:  0.22 m                             Systemic Diam: 1.60 cm   Godfrey Pick Tobb DO  Electronically signed by Berniece Salines DO  Signature Date/Time: 01/22/2022/1:59:58 PM        Final     Impression:  This 72 year old woman has a 5.0 cm aortic root and ascending aortic aneurysm which is unchanged compared to her CT scan of the chest on 01/21/2022 which I have personally reviewed and compared.  She has a trileaflet aortic valve with moderate aortic insufficiency on echocardiogram dated 01/22/2022 which I have also reviewed personally.  Her left ventricular ejection fraction is normal at 60 to 65% and left ventricular internal dimensions are normal.  Her aneurysm is still below the surgical threshold of 5.5 cm.  She had moderate aortic insufficiency last year but remains asymptomatic with normal left ventricular systolic function and internal dimensions.  There is no indication for aortic valve replacement.  I would tend to be conservative in this 72 year old woman with severe kyphoscoliosis and frailty.  I reviewed the CT scan and echocardiogram images with her and answered her questions.  I stressed the importance of good blood pressure control in preventing further enlargement and acute aortic dissection.   Plan:  I will see her back in 6 months with a CT scan of the chest without contrast at her request.  We will schedule a follow-up 2D echocardiogram in the near future and I will review that and call her with results.  I spent 60 minutes performing this consultation and > 50% of this time was spent face to face counseling and coordinating  the care of this patient's aortic root and ascending aortic aneurysm with moderate aortic insufficiency.   Gaye Pollack, MD [facility] [phone]

## 2022-12-17 ENCOUNTER — Other Ambulatory Visit: Payer: Self-pay | Admitting: Internal Medicine

## 2022-12-27 ENCOUNTER — Emergency Department (HOSPITAL_COMMUNITY)
Admission: EM | Admit: 2022-12-27 | Discharge: 2022-12-27 | Disposition: A | Discharge status: Home / Self Care | Payer: Medicare HMO | Attending: Emergency Medicine | Admitting: Emergency Medicine

## 2022-12-27 ENCOUNTER — Other Ambulatory Visit: Payer: Self-pay

## 2022-12-27 DIAGNOSIS — R12 Heartburn: Secondary | ICD-10-CM | POA: Insufficient documentation

## 2022-12-27 DIAGNOSIS — R9431 Abnormal electrocardiogram [ECG] [EKG]: Secondary | ICD-10-CM | POA: Diagnosis not present

## 2022-12-27 DIAGNOSIS — R0789 Other chest pain: Secondary | ICD-10-CM | POA: Diagnosis not present

## 2022-12-27 DIAGNOSIS — R079 Chest pain, unspecified: Secondary | ICD-10-CM | POA: Diagnosis not present

## 2022-12-27 DIAGNOSIS — R130 Aphagia: Secondary | ICD-10-CM | POA: Diagnosis not present

## 2022-12-27 LAB — URINALYSIS, ROUTINE W REFLEX MICROSCOPIC
Bilirubin Urine: NEGATIVE
Glucose, UA: NEGATIVE mg/dL
Ketones, ur: NEGATIVE mg/dL
Leukocytes,Ua: NEGATIVE
Nitrite: NEGATIVE
Protein, ur: NEGATIVE mg/dL
Specific Gravity, Urine: 1.011 (ref 1.005–1.030)
pH: 6 (ref 5.0–8.0)

## 2022-12-27 LAB — CBC WITH DIFFERENTIAL/PLATELET
Abs Immature Granulocytes: 0.03 10*3/uL (ref 0.00–0.07)
Basophils Absolute: 0.1 10*3/uL (ref 0.0–0.1)
Basophils Relative: 1 %
Eosinophils Absolute: 0.1 10*3/uL (ref 0.0–0.5)
Eosinophils Relative: 3 %
HCT: 41.2 % (ref 36.0–46.0)
Hemoglobin: 13.6 g/dL (ref 12.0–15.0)
Immature Granulocytes: 1 %
Lymphocytes Relative: 17 %
Lymphs Abs: 0.9 10*3/uL (ref 0.7–4.0)
MCH: 27.8 pg (ref 26.0–34.0)
MCHC: 33 g/dL (ref 30.0–36.0)
MCV: 84.1 fL (ref 80.0–100.0)
Monocytes Absolute: 0.2 10*3/uL (ref 0.1–1.0)
Monocytes Relative: 4 %
Neutro Abs: 4 10*3/uL (ref 1.7–7.7)
Neutrophils Relative %: 74 %
Platelets: 284 10*3/uL (ref 150–400)
RBC: 4.9 MIL/uL (ref 3.87–5.11)
RDW: 13.6 % (ref 11.5–15.5)
WBC: 5.4 10*3/uL (ref 4.0–10.5)
nRBC: 0 % (ref 0.0–0.2)

## 2022-12-27 LAB — TROPONIN I (HIGH SENSITIVITY)
Troponin I (High Sensitivity): 23 ng/L — ABNORMAL HIGH (ref <18)
Troponin I (High Sensitivity): 27 ng/L — ABNORMAL HIGH (ref <18)

## 2022-12-27 LAB — BASIC METABOLIC PANEL
Anion gap: 9 (ref 5–15)
BUN: 10 mg/dL (ref 8–23)
CO2: 25 mmol/L (ref 22–32)
Calcium: 9.7 mg/dL (ref 8.9–10.3)
Chloride: 102 mmol/L (ref 98–111)
Creatinine, Ser: 0.75 mg/dL (ref 0.44–1.00)
GFR, Estimated: >60 mL/min (ref >60)
Glucose, Bld: 104 mg/dL — ABNORMAL HIGH (ref 70–99)
Potassium: 4.2 mmol/L (ref 3.5–5.1)
Sodium: 136 mmol/L (ref 135–145)

## 2022-12-27 MED ORDER — FAMOTIDINE 20 MG PO TABS: 20.0000 mg | ORAL_TABLET | Freq: Two times a day (BID) | ORAL | 0 refills | Status: AC

## 2022-12-27 NOTE — ED Notes (Signed)
Pt refusing to allow this RN to obtain second troponin, this RN notified EDP

## 2022-12-27 NOTE — ED Notes (Signed)
Pt d/c home per MD order. Discharge summary reviewed, pt verbalizes understanding. No s/s of acute distress noted at discharge.

## 2022-12-27 NOTE — ED Notes (Signed)
Pt now willing for blood draw after speaking to the MD
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
## 2022-12-27 NOTE — Discharge Instructions (Addendum)
Return for any problem.    Testing today did not demonstrate evidence of significant problems with your heart.  Your EKG was without significant abnormality.  Your troponin levels, as discussed, were very low when we checked it both times.  A prescription for Pepcid has been sent to your pharmacy as discussed.  Please follow-up closely with your regular care provider tomorrow.

## 2022-12-27 NOTE — ED Provider Notes (Signed)
[facility] Provider Note   CSN: [id number] Arrival date & time: 12/27/22  1249     History  Chief Complaint  Patient presents with   Heartburn    Alisha Henderson is a 72 y.o. female.  72 year old female with a prior medical history as detailed below presents for evaluation.  Patient reports longstanding history with acid reflux symptoms.  Patient reports that symptoms are worse when she eats or swallows food.  She has no pain upon evaluation.  Patient reports use of both Tums and Rolaids at home with significant improvement.  However, she is requesting a prescription for Pepcid because she feels that she is using too many Tums and Rolaids on a daily basis.  Patient denies current chest pain.  Patient denies current nausea, vomiting, shortness of breath, other complaint.  Patient is reporting history of IBS.  She is not currently followed by GI.  The history is provided by the patient and medical records.       Home Medications Prior to Admission medications   Medication Sig Start Date End Date Taking? Authorizing Provider  Calcium Carbonate-Vitamin D (CALCIUM + D PO) Take 1 tablet by mouth daily.    [provider]  carboxymethylcellulose (REFRESH PLUS) 0.5 % SOLN Place 1 drop into both eyes 5 (five) times daily.    [provider]  LORazepam (ATIVAN) 1 MG tablet Take 1 tablet (1 mg total) by mouth every 8 (eight) hours. 09/26/21   Nita Sells, MD  Multiple Vitamin (DAILY VITAMINS PO) Take 15 mLs by mouth daily.    [provider]  polyethylene glycol (MIRALAX / GLYCOLAX) 17 g packet Take 17 g by mouth daily as needed for mild constipation. 09/26/21   Nita Sells, MD  propranolol (INDERAL) 10 MG tablet Take 10 mg by mouth 3 (three) times daily. Takes 10 mg first two doses and then 5 mg last dose.    [provider]      Allergies    Cephalosporins, Penicillins, Sulfa  antibiotics, Sulfites, Acetaminophen, Celery oil, Cephalexin, Ciprofloxacin, Clonazepam, Codeine, Epinephrine, Lidocaine, Nitrofurantoin, Other, and Sulfamethoxazole-trimethoprim    Review of Systems   Review of Systems  All other systems reviewed and are negative.   Physical Exam Updated Vital Signs BP (!) 153/68   Pulse 74   Temp 98.1 F (36.7 C) (Oral)   Resp 16   SpO2 99%  Physical Exam Vitals and nursing note reviewed.  Constitutional:      General: She is not in acute distress.    Appearance: Normal appearance. She is well-developed.  HENT:     Head: Normocephalic and atraumatic.  Eyes:     Conjunctiva/sclera: Conjunctivae normal.     Pupils: Pupils are equal, round, and reactive to light.  Cardiovascular:     Rate and Rhythm: Normal rate and regular rhythm.     Heart sounds: Normal heart sounds.  Pulmonary:     Effort: Pulmonary effort is normal. No respiratory distress.     Breath sounds: Normal breath sounds.  Abdominal:     General: There is no distension.     Palpations: Abdomen is soft.     Tenderness: There is no abdominal tenderness.  Musculoskeletal:        General: No deformity. Normal range of motion.     Cervical back: Normal range of motion and neck supple.  Skin:    General: Skin is warm and dry.  Neurological:     General:  No focal deficit present.     Mental Status: She is alert and oriented to person, place, and time.     ED Results / Procedures / Treatments   Labs (all labs ordered are listed, but only abnormal results are displayed) Labs Reviewed  URINALYSIS, ROUTINE W REFLEX MICROSCOPIC - Abnormal; Notable for the following components:      Result Value   Hgb urine dipstick SMALL (*)    Bacteria, UA RARE (*)    All other components within normal limits  BASIC METABOLIC PANEL - Abnormal; Notable for the following components:   Glucose, Bld 104 (*)    All other components within normal limits  TROPONIN I (HIGH SENSITIVITY) - Abnormal;  Notable for the following components:   Troponin I (High Sensitivity) 23 (*)    All other components within normal limits  CBC WITH DIFFERENTIAL/PLATELET  TROPONIN I (HIGH SENSITIVITY)    EKG EKG Interpretation  Date/Time:  Sunday December 27 2022 16:02:05 EST Ventricular Rate:  77 PR Interval:  233 QRS Duration: 130 QT Interval:  387 QTC Calculation: 438 R Axis:   97 Text Interpretation: Sinus rhythm Prolonged PR interval RAE, consider biatrial enlargement Right bundle branch block Artifact in lead(s) I II III aVR aVL aVF V1 V2 V3 Confirmed by Dene Gentry [phone]) on 12/27/2022 4:14:18 PM  Radiology No results found.  Procedures Procedures    Medications Ordered in ED Medications - No data to display  ED Course/ Medical Decision Making/ A&P                             Medical Decision Making   Medical Screen Complete  This patient presented to the ED with complaint of heartburn symptoms.  This complaint involves an extensive number of treatment options. The initial differential diagnosis includes, but is not limited to, GERD, metabolic abnormality, ACS, etc.  This presentation is: Acute, Chronic, Self-Limited, Previously Undiagnosed, Uncertain Prognosis, Complicated, Systemic Symptoms, and Threat to Life/Bodily Function  Patient is presenting with complaint of persistent symptoms secondary to acid reflux.  Patient reports that symptoms have been bothering her for the last 2 to 3 weeks.  She reports fairly good control with use of OTC medications such as Rolaids or Tums.  Patient is interested in receiving a prescription for Pepcid.  She denies chest pain, shortness of breath, nausea, vomiting.  Describe symptoms are not consistent with ACS.  EKG is without evidence of acute ischemia.  Troponin x 2 is minimally detectable without significant delta.  Patient offered -  but declines - additional workup and/or observation.  Patient desires discharge home.  Patient  does understand need for close outpatient follow-up.  Strict return precautions given and understood.  Additional history obtained:  External records from outside sources obtained and reviewed including prior ED visits and prior Inpatient records.    Lab Tests:  I ordered and personally interpreted labs.  The pertinent results include: CBC, BMP, troponin x 2, UA   Cardiac Monitoring:  The patient was maintained on a cardiac monitor.  I personally viewed and interpreted the cardiac monitor which showed an underlying rhythm of: NSR  Problem List / ED Course:  GERD   Reevaluation:  After the interventions noted above, I reevaluated the patient and found that they have: stayed the same  Disposition:  After consideration of the diagnostic results and the patients response to treatment, I feel that the patent would benefit from close outpatient  follow-up.          Final Clinical Impression(s) / ED Diagnoses Final diagnoses:  Heartburn symptom    Rx / DC Orders ED Discharge Orders          Ordered    famotidine (PEPCID) 20 MG tablet  2 times daily        12/27/22 1935              Valarie Merino, MD 12/27/22 1940

## 2022-12-27 NOTE — ED Triage Notes (Signed)
Pt states "I feel like I need to belch and I can't. When I try to swallow I have some Nausea/vomiting and the acid feeling is only there after drinking something. I take roll-aids and tums but need something strong" Denies cp/sob.

## 2022-12-29 DIAGNOSIS — K219 Gastro-esophageal reflux disease without esophagitis: Secondary | ICD-10-CM | POA: Diagnosis not present

## 2022-12-29 DIAGNOSIS — R079 Chest pain, unspecified: Secondary | ICD-10-CM | POA: Diagnosis not present

## 2022-12-29 DIAGNOSIS — S60221A Contusion of right hand, initial encounter: Secondary | ICD-10-CM | POA: Diagnosis not present

## 2023-01-01 ENCOUNTER — Ambulatory Visit: Payer: Medicare HMO | Attending: Physician Assistant | Admitting: Medical

## 2023-01-01 ENCOUNTER — Other Ambulatory Visit
Admission: RE | Admit: 2023-01-01 | Discharge: 2023-01-01 | Disposition: A | Discharge status: Home / Self Care | Payer: Medicare HMO | Source: Ambulatory Visit | Attending: Medical | Admitting: Medical

## 2023-01-01 ENCOUNTER — Encounter: Payer: Self-pay | Admitting: Medical

## 2023-01-01 ENCOUNTER — Ambulatory Visit: Payer: Medicare HMO | Admitting: Physician Assistant

## 2023-01-01 VITALS — BP 136/50 | HR 73 | Ht 66.0 in | Wt 130.0 lb

## 2023-01-01 DIAGNOSIS — I700 Atherosclerosis of aorta: Secondary | ICD-10-CM | POA: Insufficient documentation

## 2023-01-01 DIAGNOSIS — R7989 Other specified abnormal findings of blood chemistry: Secondary | ICD-10-CM | POA: Diagnosis not present

## 2023-01-01 DIAGNOSIS — I7121 Aneurysm of the ascending aorta, without rupture: Secondary | ICD-10-CM | POA: Diagnosis not present

## 2023-01-01 DIAGNOSIS — I351 Nonrheumatic aortic (valve) insufficiency: Secondary | ICD-10-CM

## 2023-01-01 DIAGNOSIS — I2510 Atherosclerotic heart disease of native coronary artery without angina pectoris: Secondary | ICD-10-CM | POA: Diagnosis not present

## 2023-01-01 DIAGNOSIS — I2584 Coronary atherosclerosis due to calcified coronary lesion: Secondary | ICD-10-CM | POA: Diagnosis not present

## 2023-01-01 DIAGNOSIS — I081 Rheumatic disorders of both mitral and tricuspid valves: Secondary | ICD-10-CM | POA: Insufficient documentation

## 2023-01-01 DIAGNOSIS — I517 Cardiomegaly: Secondary | ICD-10-CM | POA: Insufficient documentation

## 2023-01-01 DIAGNOSIS — I341 Nonrheumatic mitral (valve) prolapse: Secondary | ICD-10-CM

## 2023-01-01 LAB — LDL CHOLESTEROL, DIRECT: Direct LDL: 116 mg/dL — ABNORMAL HIGH (ref 0–99)

## 2023-01-01 LAB — LIPID PANEL
Cholesterol: 196 mg/dL (ref 0–200)
HDL: 62 mg/dL (ref >40)
LDL Cholesterol: 114 mg/dL — ABNORMAL HIGH (ref 0–99)
Total CHOL/HDL Ratio: 3.2 RATIO
Triglycerides: 98 mg/dL (ref <150)
VLDL: 20 mg/dL (ref 0–40)

## 2023-01-01 MED ORDER — ASPIRIN 81 MG PO TBEC: 81.0000 mg | DELAYED_RELEASE_TABLET | Freq: Every day | ORAL | 0 refills | Status: AC

## 2023-01-01 NOTE — Progress Notes (Unsigned)
Cardiology Office Note:    Date:  01/04/2023   ID:  [place], [place] 10-17-1951, MRN [id number]  PCP:  Shirline Frees, MD  [facility] Cardiologist:  None  [facility] Electrophysiologist:  None   Referring MD: Shirline Frees, MD   Chief Complaint: 1 year follow-up  History of Present Illness:    Alisha Henderson is a 72 y.o. female with a hx of ascending aortic aneurysm, moderate aortic regurgitation documented in 01/2022, mitral valve prolapse who presents for 1 year follow-up.   The patient was originally referred for ascending aortic aneurysm on CT. Echo was ordered by PCP. Echo showed LVEF 60-65%, no WMA, mild LVH, mild MR, mild prolapse of posterior leaflet of the mitral valve, moderate AI, aortic aneurysm measuring 90m.   She was seen 02/2022 first as a new patient. Plan was for echo follow-up in 1 year. CTA of the chest and aorta was ordered.   The patient went to the ER for heartburn 12/27/22. HS troponin was elevated to 27.   Today, the patient reports chest wall pain for years. She has been on propranolol for years. She has acid reflux and has been taking Pepcid. She feels that on Saturday she couldn't swallow. She denies SOB, LLE, orthopnea or pnd. The ER visit was reviewed.   Past Medical History:  Diagnosis Date   Aneurysm of ascending aorta (HCC)    Anxiety    Degenerative disc disease, lumbar    Mitral valve prolapse    Palpitations    Psoriasis     History reviewed. No pertinent surgical history.  Current Medications: Current Meds  Medication Sig   aspirin EC 81 MG tablet Take 1 tablet (81 mg total) by mouth daily. Swallow whole.   Calcium Carbonate-Vitamin D (CALCIUM + D PO) Take 1 tablet by mouth daily.   carboxymethylcellulose (REFRESH PLUS) 0.5 % SOLN Place 1 drop into both eyes 5 (five) times daily.   famotidine (PEPCID) 20 MG tablet Take 1 tablet (20 mg total) by mouth 2 (two) times daily.   LORazepam (ATIVAN) 1 MG tablet Take 1 tablet (1  mg total) by mouth every 8 (eight) hours.   Multiple Vitamin (DAILY VITAMINS PO) Take by mouth daily. 1 gummy daily   polyethylene glycol (MIRALAX / GLYCOLAX) 17 g packet Take 17 g by mouth daily as needed for mild constipation.   propranolol (INDERAL) 10 MG tablet Take 10 mg by mouth 3 (three) times daily. Takes 10 mg first two doses and then 5 mg last dose.     Allergies:   Cephalosporins, Penicillins, Sulfa antibiotics, Sulfites, Acetaminophen, Celery oil, Cephalexin, Ciprofloxacin, Clonazepam, Codeine, Epinephrine, Lidocaine, Nitrofurantoin, Other, and Sulfamethoxazole-trimethoprim   Social History   Socioeconomic History   Marital status: Single    Spouse name: Not on file   Number of children: Not on file   Years of education: Not on file   Highest education level: Not on file  Occupational History   Not on file  Tobacco Use   Smoking status: Never   Smokeless tobacco: Never  Substance and Sexual Activity   Alcohol use: No   Drug use: No   Sexual activity: Not on file  Other Topics Concern   Not on file  Social History Narrative   Not on file   Social Determinants of Health   Financial Resource Strain: Not on file  Food Insecurity: Not on file  Transportation Needs: Not on file  Physical Activity: Not on file  Stress: Not on  file  Social Connections: Not on file     Family History: The patient's family history is not on file.  ROS:   Please see the history of present illness.     All other systems reviewed and are negative.  EKGs/Labs/Other Studies Reviewed:    The following studies were reviewed today:  Echo 12/2021  1. Left ventricular ejection fraction, by estimation, is 60 to 65%. The  left ventricle has normal function. The left ventricle has no regional  wall motion abnormalities. There is mild concentric left ventricular  hypertrophy. Left ventricular diastolic  parameters are indeterminate.   2. Right ventricular systolic function is normal. The  right ventricular  size is normal. There is normal pulmonary artery systolic pressure.   3. Left atrial size was mildly dilated.   4. Right atrial size was mildly dilated.   5. The mitral valve is normal in structure. Mild mitral valve  regurgitation. No evidence of mitral stenosis. There is mild prolapse of  posterior leaflet of the mitral valve.   6. The aortic valve is tricuspid. Aortic valve regurgitation is moderate.  No aortic stenosis is present. Aortic regurgitation PHT measures 299 msec.   7. Aneurysm of the ascending aorta, measuring 43 mm.   8. The inferior vena cava is normal in size with greater than 50%  respiratory variability, suggesting right atrial pressure of 3 mmHg.   Comparison(s): No prior Echocardiogram.   Chest CT with contrast 01/21/2022: IMPRESSION: 1. 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 Incomplete characterization in the absence of IV contrast. Recommend semi-annual imaging followup by CTA or MRA and referral to cardiothoracic surgery if not already obtained. This recommendation follows 2010 ACCF/AHA/AATS/ACR/ASA/SCA/SCAI/SIR/STS/SVM Guidelines for the Diagnosis and Management of Patients With Thoracic Aortic Disease. Circulation. 2010; 121: M086-P619. 2.  Aortic Atherosclerosis (ICD10-170.0). 3. Stable appearance of thoracic and lumbar compression fracture deformities.    EKG:  EKG is ordered today.  The ekg ordered today demonstrates NSR 73bpm, RBB, nonspecific ST changes, no change since prior  Recent Labs: 12/27/2022: BUN 10; Creatinine, Ser 0.75; Hemoglobin 13.6; Platelets 284; Potassium 4.2; Sodium 136  Recent Lipid Panel    Component Value Date/Time   CHOL 196 01/01/2023 1222   TRIG 98 01/01/2023 1222   HDL 62 01/01/2023 1222   CHOLHDL 3.2 01/01/2023 1222   VLDL 20 01/01/2023 1222   LDLCALC 114 (H) 01/01/2023 1222   LDLDIRECT 116 (H) 01/01/2023 1222      Physical Exam:    VS:  BP (!) 136/50 (BP Location: Left Arm, Patient  Position: Sitting, Cuff Size: Normal)   Pulse 73   Ht '5\' 6"'$  (1.676 m)   Wt 130 lb (59 kg)   SpO2 98%   BMI 20.98 kg/m     Wt Readings from Last 3 Encounters:  01/01/23 130 lb (59 kg)  12/16/22 106 lb (48.1 kg)  03/09/22 103 lb 9.6 oz (47 kg)     GEN:  Well nourished, well developed in no acute distress HEENT: Normal NECK: No JVD; No carotid bruits LYMPHATICS: No lymphadenopathy CARDIAC: RRR, +murmur, no rubs, gallops RESPIRATORY:  Clear to auscultation without rales, wheezing or rhonchi  ABDOMEN: Soft, non-tender, non-distended MUSCULOSKELETAL:  No edema; No deformity  SKIN: Warm and dry NEUROLOGIC:  Alert and oriented x 3 PSYCHIATRIC:  Normal affect   ASSESSMENT:    1. Aneurysm of ascending aorta without rupture ([place])   2. Mitral valve prolapse   3. Nonrheumatic aortic valve insufficiency   4. Coronary artery calcification  5. Elevated troponin    PLAN:    In order of problems listed above:  Ascending aortic aneurysm Prior imaging showed 4.9 cm in diameter. Recommendation was for repeat CTA of chest/aorta. This was ordered, but not completed. I will schedule CTA chest aorta. Continue BB therapy. Patient will need referral to CTS.   Elevated troponin Recent ER visit for GERD found to have troponin elevated to 27. Prior CT imaging showed no significant coronary artery calcifications. The patient reports chest wall pain for years. I will start ASA '81mg'$  daily. Check lipid panel today. At follow-up we may consider Cardiac CTA.   Mitral Valve Prolapse  Aortic valve Insufficiency Prior echo showed normal LVEF with mild MR, moderate AI. Repeat echo as above.  Disposition: Follow up in 2 month(s) with MD/APP    Signed, Axavier Pressley Ninfa Meeker, PA-C  01/04/2023 7:48 AM    [facility]

## 2023-01-01 NOTE — Patient Instructions (Addendum)
Medication Instructions:  START aspirin 81 mg by mouth daily.  *If you need a refill on your cardiac medications before your next appointment, please call your pharmacy*   Lab Work: LIPID panel and direct LDL - Please go to the [facility]. You will check in at the front desk to the right as you walk into the atrium. Valet Parking is offered if needed. - No appointment needed. You may go any day between 7 am and 6 pm.  If you have labs (blood work) drawn today and your tests are completely normal, you will receive your results only by: [place] (if you have MyChart) OR A paper copy in the mail If you have any lab test that is abnormal or we need to change your treatment, we will call you to review the results.   Testing/Procedures: Non-Cardiac CT scanning, (CAT scanning), is a noninvasive, special x-ray that produces cross-sectional images of the body using x-rays and a computer. CT scans help physicians diagnose and treat medical conditions. For some CT exams, a contrast material is used to enhance visibility in the area of the body being studied. CT scans provide greater clarity and reveal more details than regular x-ray exams.   Follow-Up: At [facility], you and your health needs are our priority.  As part of our continuing mission to provide you with exceptional heart care, we have created designated Provider Care Teams.  These Care Teams include your primary Cardiologist (physician) and Advanced Practice Providers (APPs -  Physician Assistants and Nurse Practitioners) who all work together to provide you with the care you need, when you need it.  We recommend signing up for the patient portal called "MyChart".  Sign up information is provided on this After Visit Summary.  MyChart is used to connect with patients for Virtual Visits (Telemedicine).  Patients are able to view lab/test results, encounter notes, upcoming appointments, etc.  Non-urgent messages can be  sent to your provider as well.   To learn more about what you can do with MyChart, go to [email or web address].    Your next appointment:   2 month(s)  Provider:   You may see Dr. Candee Furbish or one of the following Advanced Practice Providers on your designated Care Team:   Murray Hodgkins, NP Christell Faith, PA-C Cadence Kathlen Mody, PA-C Gerrie Nordmann, NP

## 2023-01-14 ENCOUNTER — Ambulatory Visit (HOSPITAL_COMMUNITY): Payer: Medicare HMO | Attending: Surgery

## 2023-01-14 DIAGNOSIS — I7121 Aneurysm of the ascending aorta, without rupture: Secondary | ICD-10-CM | POA: Diagnosis not present

## 2023-01-14 LAB — ECHOCARDIOGRAM COMPLETE
Area-P 1/2: 5.07 cm2
P 1/2 time: 350 msec
S' Lateral: 3.3 cm

## 2023-01-21 ENCOUNTER — Ambulatory Visit: Admit: 2023-01-21 | Payer: Medicare HMO

## 2023-01-21 SURGERY — CT WITH ANESTHESIA
Anesthesia: General

## 2023-01-25 ENCOUNTER — Telehealth: Payer: Self-pay | Admitting: Cardiology

## 2023-01-25 NOTE — Telephone Encounter (Signed)
Pt states she is returning a call in regards to results. Requesting return call.

## 2023-01-25 NOTE — Telephone Encounter (Signed)
Patient was trying to call Dr. Vivi Martens office back. She has been given the number to that office.

## 2023-01-26 ENCOUNTER — Other Ambulatory Visit: Payer: Self-pay | Admitting: *Deleted

## 2023-02-09 DIAGNOSIS — Z9989 Dependence on other enabling machines and devices: Secondary | ICD-10-CM | POA: Diagnosis not present

## 2023-02-09 DIAGNOSIS — Z79899 Other long term (current) drug therapy: Secondary | ICD-10-CM | POA: Diagnosis not present

## 2023-02-09 DIAGNOSIS — E46 Unspecified protein-calorie malnutrition: Secondary | ICD-10-CM | POA: Diagnosis not present

## 2023-02-09 DIAGNOSIS — E559 Vitamin D deficiency, unspecified: Secondary | ICD-10-CM | POA: Diagnosis not present

## 2023-02-09 DIAGNOSIS — M8000XS Age-related osteoporosis with current pathological fracture, unspecified site, sequela: Secondary | ICD-10-CM | POA: Diagnosis not present

## 2023-02-09 DIAGNOSIS — K219 Gastro-esophageal reflux disease without esophagitis: Secondary | ICD-10-CM | POA: Diagnosis not present

## 2023-02-09 DIAGNOSIS — M4850XA Collapsed vertebra, not elsewhere classified, site unspecified, initial encounter for fracture: Secondary | ICD-10-CM | POA: Diagnosis not present

## 2023-02-22 DIAGNOSIS — I7121 Aneurysm of the ascending aorta, without rupture: Secondary | ICD-10-CM | POA: Diagnosis not present

## 2023-02-22 DIAGNOSIS — I8310 Varicose veins of unspecified lower extremity with inflammation: Secondary | ICD-10-CM | POA: Diagnosis not present

## 2023-02-22 DIAGNOSIS — R002 Palpitations: Secondary | ICD-10-CM | POA: Diagnosis not present

## 2023-02-22 DIAGNOSIS — F419 Anxiety disorder, unspecified: Secondary | ICD-10-CM | POA: Diagnosis not present

## 2023-02-22 DIAGNOSIS — E7800 Pure hypercholesterolemia, unspecified: Secondary | ICD-10-CM | POA: Diagnosis not present

## 2023-02-22 DIAGNOSIS — M549 Dorsalgia, unspecified: Secondary | ICD-10-CM | POA: Diagnosis not present

## 2023-02-22 DIAGNOSIS — Z8781 Personal history of (healed) traumatic fracture: Secondary | ICD-10-CM | POA: Diagnosis not present

## 2023-02-23 ENCOUNTER — Telehealth: Payer: Self-pay

## 2023-02-23 NOTE — Telephone Encounter (Signed)
Opened in error

## 2023-03-02 ENCOUNTER — Ambulatory Visit: Payer: Medicare HMO | Admitting: Medical

## 2023-03-17 DIAGNOSIS — H2702 Aphakia, left eye: Secondary | ICD-10-CM | POA: Diagnosis not present

## 2023-03-17 DIAGNOSIS — H168 Other keratitis: Secondary | ICD-10-CM | POA: Diagnosis not present

## 2023-03-17 DIAGNOSIS — B999 Unspecified infectious disease: Secondary | ICD-10-CM | POA: Diagnosis not present

## 2023-03-17 DIAGNOSIS — H59021 Cataract (lens) fragments in eye following cataract surgery, right eye: Secondary | ICD-10-CM | POA: Diagnosis not present

## 2023-03-22 ENCOUNTER — Telehealth: Payer: Self-pay

## 2023-03-22 NOTE — Telephone Encounter (Signed)
Spoke with Pieter Partridge at [facility] to see if patient had CT Angio chest aorta ordered by Cadence in Feb 2024. She states their office contacted the patient at that time and she told them she did not want to schedule at that time and would call back at a later date. Pieter Partridge states once they make contact with the patient they wait for the patient to call back as she stated and they do not try to reach out at a later date. Patient has F/U appt with Cadence Furth on 03/30/23. Please advise.

## 2023-03-30 ENCOUNTER — Ambulatory Visit: Payer: Medicare HMO | Attending: Medical | Admitting: Medical

## 2023-03-30 ENCOUNTER — Encounter: Payer: Self-pay | Admitting: Medical

## 2023-03-30 ENCOUNTER — Telehealth: Payer: Self-pay | Admitting: Medical

## 2023-03-30 VITALS — BP 154/64 | HR 68 | Ht 66.5 in | Wt 129.8 lb

## 2023-03-30 DIAGNOSIS — I2584 Coronary atherosclerosis due to calcified coronary lesion: Secondary | ICD-10-CM | POA: Diagnosis not present

## 2023-03-30 DIAGNOSIS — I7121 Aneurysm of the ascending aorta, without rupture: Secondary | ICD-10-CM | POA: Diagnosis not present

## 2023-03-30 DIAGNOSIS — I2510 Atherosclerotic heart disease of native coronary artery without angina pectoris: Secondary | ICD-10-CM | POA: Diagnosis not present

## 2023-03-30 DIAGNOSIS — I341 Nonrheumatic mitral (valve) prolapse: Secondary | ICD-10-CM | POA: Diagnosis not present

## 2023-03-30 NOTE — Patient Instructions (Addendum)
Medication Instructions:   Your physician recommends that you continue on your current medications as directed. Please refer to the Current Medication list given to you today.  *If you need a refill on your cardiac medications before your next appointment, please call your pharmacy*   Lab Work:  None Ordered  If you have labs (blood work) drawn today and your tests are completely normal, you will receive your results only by: MyChart Message (if you have MyChart) OR A paper copy in the mail If you have any lab test that is abnormal or we need to change your treatment, we will call you to review the results.   Testing/Procedures:  None Ordered   Follow-Up: At [facility], you and your health needs are our priority.  As part of our continuing mission to provide you with exceptional heart care, we have created designated Provider Care Teams.  These Care Teams include your primary Cardiologist (physician) and Advanced Practice Providers (APPs -  Physician Assistants and Nurse Practitioners) who all work together to provide you with the care you need, when you need it.  We recommend signing up for the patient portal called "MyChart".  Sign up information is provided on this After Visit Summary.  MyChart is used to connect with patients for Virtual Visits (Telemedicine).  Patients are able to view lab/test results, encounter notes, upcoming appointments, etc.  Non-urgent messages can be sent to your provider as well.   To learn more about what you can do with MyChart, go to [email or web address].    Your next appointment:    As scheduled with Jake Bathe, MD 05/28/2023 @ 9:20

## 2023-03-30 NOTE — Progress Notes (Unsigned)
Cardiology Office Note:    Date:  03/30/2023   ID:  [name], [name] 06-03-1951, MRN [id number]  PCP:  Johny Blamer, MD  [facility] Cardiologist:  None  [facility] Electrophysiologist:  None   Referring MD: Johny Blamer, MD   Chief Complaint: 2 month follow-up  History of Present Illness:    Alisha Henderson is a 72 y.o. female with a hx of  ascending aortic aneurysm, moderate aortic regurgitation documented in 01/2022, mitral valve prolapse who presents for 1 year follow-up.    The patient was originally referred for ascending aortic aneurysm on CT. Echo was ordered by PCP. Echo showed LVEF 60-65%, no WMA, mild LVH, mild MR, mild prolapse of posterior leaflet of the mitral valve, moderate AI, aortic aneurysm measuring 43mm.    She was seen 02/2022 first as a new patient. Plan was for echo follow-up in 1 year. CTA of the chest and aorta was ordered.    The patient went to the ER for heartburn 12/27/22. HS troponin was elevated to 23>27.  Patient was ultimately discharged on Pepcid.  Patient was last seen 01/01/2023 reporting chest wall pain for years.  Patient was started on aspirin 81 mg daily. Echo was ordered, which showed LVEF 50-55%, mild LVH, mildly reduced RV, mildly dilated LA, severely dilated RA, moderate MR, ascending aorta aneurysm measuring 49mm.   Today, the patient reports she has been doing well. BP is a little high today. It is normal at home. No chest pain, shortness of breath, lower leg edema, orthopnea or pnd. The patient reports she contacted Dr. Anne Fu to discuss aortic aneurysm follow-up. She said he may want CT scan without contrast since she has a dye allergy.  She is not wanting to see a cardiothoracic surgeon. She wants to speak with Dr. Anne Fu before pursuing referral to surgeon. I offered to use pre-medication before using contrast, but she is refusing.   Past Medical History:  Diagnosis Date   Aneurysm of ascending aorta (HCC)    Anxiety     Degenerative disc disease, lumbar    Mitral valve prolapse    Palpitations    Psoriasis     History reviewed. No pertinent surgical history.  Current Medications: Current Meds  Medication Sig   aspirin EC 81 MG tablet Take 1 tablet (81 mg total) by mouth daily. Swallow whole.   Calcium Carbonate-Vitamin D (CALCIUM + D PO) Take 1 tablet by mouth daily.   carboxymethylcellulose (REFRESH PLUS) 0.5 % SOLN Place 1 drop into both eyes 5 (five) times daily.   famotidine (PEPCID) 20 MG tablet Take 1 tablet (20 mg total) by mouth 2 (two) times daily.   LORazepam (ATIVAN) 1 MG tablet Take 1 tablet (1 mg total) by mouth every 8 (eight) hours.   Multiple Vitamin (DAILY VITAMINS PO) Take by mouth daily. 1 gummy daily   polyethylene glycol (MIRALAX / GLYCOLAX) 17 g packet Take 17 g by mouth daily as needed for mild constipation.   propranolol (INDERAL) 10 MG tablet Take 10 mg by mouth 3 (three) times daily.     Allergies:   Cephalosporins, Penicillins, Sulfa antibiotics, Sulfites, Acetaminophen, Celery oil, Cephalexin, Ciprofloxacin, Clonazepam, Codeine, Epinephrine, Lidocaine, Nitrofurantoin, Other, and Sulfamethoxazole-trimethoprim   Social History   Socioeconomic History   Marital status: Single    Spouse name: Not on file   Number of children: Not on file   Years of education: Not on file   Highest education level: Not on file  Occupational History  Not on file  Tobacco Use   Smoking status: Never   Smokeless tobacco: Never  Substance and Sexual Activity   Alcohol use: No   Drug use: No   Sexual activity: Not on file  Other Topics Concern   Not on file  Social History Narrative   Not on file   Social Determinants of Health   Financial Resource Strain: Not on file  Food Insecurity: Not on file  Transportation Needs: Not on file  Physical Activity: Not on file  Stress: Not on file  Social Connections: Not on file     Family History: The patient's family history is not on  file.  ROS:   Please see the history of present illness.     All other systems reviewed and are negative.  EKGs/Labs/Other Studies Reviewed:    The following studies were reviewed today:  Echo 12/2022 1. Left ventricular ejection fraction, by estimation, is 50 to 55%. The  left ventricle has low normal function. The left ventricle has no regional  wall motion abnormalities. There is mild concentric left ventricular  hypertrophy. Left ventricular  diastolic parameters are indeterminate.   2. Right ventricular systolic function is mildly reduced. The right  ventricular size is moderately enlarged.   3. Left atrial size was mildly dilated.   4. Right atrial size was severely dilated.   5. The mitral valve is normal in structure. Moderate mitral valve  regurgitation. No evidence of mitral stenosis.   6. Tricuspid valve regurgitation is moderate.   7. The aortic valve is normal in structure. Aortic valve regurgitation is  moderate. No aortic stenosis is present.   8. Aneurysm of the ascending aorta, measuring 49 mm.   9. The inferior vena cava is normal in size with greater than 50%  respiratory variability, suggesting right atrial pressure of 3 mmHg.   EKG:  EKG is not ordered today.    Recent Labs: 12/27/2022: BUN 10; Creatinine, Ser 0.75; Hemoglobin 13.6; Platelets 284; Potassium 4.2; Sodium 136  Recent Lipid Panel    Component Value Date/Time   CHOL 196 01/01/2023 1222   TRIG 98 01/01/2023 1222   HDL 62 01/01/2023 1222   CHOLHDL 3.2 01/01/2023 1222   VLDL 20 01/01/2023 1222   LDLCALC 114 (H) 01/01/2023 1222   LDLDIRECT 116 (H) 01/01/2023 1222    Physical Exam:    VS:  BP (!) 154/64 (BP Location: Left Arm, Patient Position: Sitting, Cuff Size: Normal)   Pulse 68   Ht 5' 6.5" (1.689 m)   Wt 129 lb 12.8 oz (58.9 kg)   SpO2 98%   BMI 20.64 kg/m     Wt Readings from Last 3 Encounters:  03/30/23 129 lb 12.8 oz (58.9 kg)  01/01/23 130 lb (59 kg)  12/16/22 106 lb (48.1  kg)     GEN:  Well nourished, well developed in no acute distress HEENT: Normal NECK: No JVD; No carotid bruits LYMPHATICS: No lymphadenopathy CARDIAC: RRR, + murmur, no rubs, gallops RESPIRATORY:  Clear to auscultation without rales, wheezing or rhonchi  ABDOMEN: Soft, non-tender, non-distended MUSCULOSKELETAL:  No edema; No deformity  SKIN: Warm and dry NEUROLOGIC:  Alert and oriented x 3 PSYCHIATRIC:  Normal affect   ASSESSMENT:    1. Aneurysm of ascending aorta without rupture (HCC)   2. Coronary artery calcification   3. Mitral valve prolapse    PLAN:    In order of problems listed above:  Ascending Aroric aneurysm Prior imaging showed the aortic aneurysm  4.9 cm in diameter, recommendation was for repeat CT of chest/aorta.  This was previously ordered but not completed.  Patient has an allergy to dye reporting anaphylaxis.  I offered premedicating for dye allergy prior to CT but patient is refusing.  Most recent echo showed aortic aneurysm 4.37mm. Patient reports she spoke to Dr. Anne Fu on the phone, and he is wanting further imaging. Patient reports she is not wanting to see a cardiothoracic surgeon, despite prior recommendations for this.  I will message Dr. Anne Fu for further recommendations.  Coronary artery calcifications Patient denies any further chest pain.  Prior CT imaging showed no significant coronary artery calcifications.  No further ischemic workup at this time.  Continue aspirin 81 mg daily.  Mitral valve prolapse  Most recent echo showed LVEF 50 to 55%, mild LVH, moderate MR.  Disposition: Follow up in 2 month(s) with MD     Signed, Makarios Madlock David Stall, PA-C  03/30/2023 4:16 PM    Alisha Henderson

## 2023-03-30 NOTE — Telephone Encounter (Signed)
Pt is calling to sch ct chest wo contrast. I see ct chest angio was ordered but unsure if that is the same thing. Please advise.

## 2023-03-30 NOTE — Telephone Encounter (Signed)
Called pt back, she states she would like to have it done for Cadence, PA. I will help her sch tomorrow morning.

## 2023-03-31 DIAGNOSIS — H59021 Cataract (lens) fragments in eye following cataract surgery, right eye: Secondary | ICD-10-CM | POA: Diagnosis not present

## 2023-03-31 DIAGNOSIS — H168 Other keratitis: Secondary | ICD-10-CM | POA: Diagnosis not present

## 2023-03-31 DIAGNOSIS — H2702 Aphakia, left eye: Secondary | ICD-10-CM | POA: Diagnosis not present

## 2023-04-01 ENCOUNTER — Telehealth: Payer: Self-pay | Admitting: Cardiology

## 2023-04-01 DIAGNOSIS — I7121 Aneurysm of the ascending aorta, without rupture: Secondary | ICD-10-CM

## 2023-04-01 DIAGNOSIS — I2510 Atherosclerotic heart disease of native coronary artery without angina pectoris: Secondary | ICD-10-CM

## 2023-04-01 NOTE — Telephone Encounter (Signed)
Patient is returning call. Requesting return call.  

## 2023-04-01 NOTE — Telephone Encounter (Signed)
Called patient back. Patient stated that Cadence Furth PA ordered her a chest CT without contrast to be done at the hospital, but she wants to see if she can have it done at [facility] in [place] or [place]. She stated the hospital is just too costly. Will forward to Cadence for advisement.

## 2023-04-01 NOTE — Telephone Encounter (Addendum)
MRI of chest ordered as requested  Cadence Furth PA, confirmed correct test order    ----- Message from Cadence David Stall, PA-C sent at 04/01/2023  8:48 AM EDT ----- Can we order MRI of the aorta as this does not require contrast dye. Patient may request certain imaging center given where insurance will and will not cover. Thanks

## 2023-04-05 NOTE — Telephone Encounter (Signed)
Called patient to let her know location of CT has been changed.

## 2023-04-07 ENCOUNTER — Telehealth: Payer: Self-pay | Admitting: Medical

## 2023-04-07 NOTE — Telephone Encounter (Signed)
New order placed and procedure is scheduled 05/07/2023  3:00 PM at [facility] AT [address]

## 2023-04-07 NOTE — Telephone Encounter (Signed)
Pt would like a callback regarding CT order. Pt states that [facility] will not schedule her due to the order not saying " without contrast". Pt would like a callback regarding this matter as soon as possible. Please advise

## 2023-04-12 DIAGNOSIS — R002 Palpitations: Secondary | ICD-10-CM | POA: Diagnosis not present

## 2023-04-28 DIAGNOSIS — H168 Other keratitis: Secondary | ICD-10-CM | POA: Diagnosis not present

## 2023-04-28 DIAGNOSIS — H2702 Aphakia, left eye: Secondary | ICD-10-CM | POA: Diagnosis not present

## 2023-05-07 ENCOUNTER — Telehealth: Payer: Self-pay | Admitting: Medical

## 2023-05-07 ENCOUNTER — Other Ambulatory Visit: Payer: Medicare HMO

## 2023-05-07 NOTE — Telephone Encounter (Signed)
Patient would like to know why a CT was ordered.

## 2023-05-20 DIAGNOSIS — R030 Elevated blood-pressure reading, without diagnosis of hypertension: Secondary | ICD-10-CM | POA: Diagnosis not present

## 2023-05-26 DIAGNOSIS — E7800 Pure hypercholesterolemia, unspecified: Secondary | ICD-10-CM | POA: Diagnosis not present

## 2023-05-26 DIAGNOSIS — I7121 Aneurysm of the ascending aorta, without rupture: Secondary | ICD-10-CM | POA: Diagnosis not present

## 2023-05-26 DIAGNOSIS — G9520 Unspecified cord compression: Secondary | ICD-10-CM | POA: Diagnosis not present

## 2023-05-26 DIAGNOSIS — R002 Palpitations: Secondary | ICD-10-CM | POA: Diagnosis not present

## 2023-05-26 DIAGNOSIS — F419 Anxiety disorder, unspecified: Secondary | ICD-10-CM | POA: Diagnosis not present

## 2023-05-26 DIAGNOSIS — M549 Dorsalgia, unspecified: Secondary | ICD-10-CM | POA: Diagnosis not present

## 2023-05-26 DIAGNOSIS — F320 Major depressive disorder, single episode, mild: Secondary | ICD-10-CM | POA: Diagnosis not present

## 2023-05-28 ENCOUNTER — Ambulatory Visit: Payer: Medicare HMO | Admitting: Cardiology

## 2023-06-09 DIAGNOSIS — H15092 Other scleritis, left eye: Secondary | ICD-10-CM | POA: Diagnosis not present

## 2023-06-09 DIAGNOSIS — H189 Unspecified disorder of cornea: Secondary | ICD-10-CM | POA: Diagnosis not present

## 2023-06-09 DIAGNOSIS — H2702 Aphakia, left eye: Secondary | ICD-10-CM | POA: Diagnosis not present

## 2023-06-15 DIAGNOSIS — M8000XS Age-related osteoporosis with current pathological fracture, unspecified site, sequela: Secondary | ICD-10-CM | POA: Diagnosis not present

## 2023-06-15 DIAGNOSIS — E559 Vitamin D deficiency, unspecified: Secondary | ICD-10-CM | POA: Diagnosis not present

## 2023-06-15 DIAGNOSIS — Z8781 Personal history of (healed) traumatic fracture: Secondary | ICD-10-CM | POA: Diagnosis not present

## 2023-06-15 DIAGNOSIS — R002 Palpitations: Secondary | ICD-10-CM | POA: Diagnosis not present

## 2023-06-15 DIAGNOSIS — E46 Unspecified protein-calorie malnutrition: Secondary | ICD-10-CM | POA: Diagnosis not present

## 2023-06-17 DIAGNOSIS — R300 Dysuria: Secondary | ICD-10-CM | POA: Diagnosis not present

## 2023-07-06 ENCOUNTER — Other Ambulatory Visit: Payer: Self-pay | Admitting: Thoracic Surgery (Cardiothoracic Vascular Surgery)

## 2023-07-06 DIAGNOSIS — I7121 Aneurysm of the ascending aorta, without rupture: Secondary | ICD-10-CM

## 2023-07-22 ENCOUNTER — Ambulatory Visit
Admission: RE | Admit: 2023-07-22 | Discharge: 2023-07-22 | Disposition: A | Discharge status: Home / Self Care | Payer: Medicare HMO | Source: Ambulatory Visit | Attending: Thoracic Surgery (Cardiothoracic Vascular Surgery) | Admitting: Thoracic Surgery (Cardiothoracic Vascular Surgery)

## 2023-07-22 DIAGNOSIS — I7121 Aneurysm of the ascending aorta, without rupture: Secondary | ICD-10-CM | POA: Diagnosis not present

## 2023-07-23 DIAGNOSIS — I700 Atherosclerosis of aorta: Secondary | ICD-10-CM | POA: Diagnosis not present

## 2023-07-23 DIAGNOSIS — F411 Generalized anxiety disorder: Secondary | ICD-10-CM | POA: Diagnosis not present

## 2023-07-23 DIAGNOSIS — I7121 Aneurysm of the ascending aorta, without rupture: Secondary | ICD-10-CM | POA: Diagnosis not present

## 2023-07-23 DIAGNOSIS — M810 Age-related osteoporosis without current pathological fracture: Secondary | ICD-10-CM | POA: Diagnosis not present

## 2023-07-30 DIAGNOSIS — L2089 Other atopic dermatitis: Secondary | ICD-10-CM | POA: Diagnosis not present

## 2023-07-31 DIAGNOSIS — N898 Other specified noninflammatory disorders of vagina: Secondary | ICD-10-CM | POA: Diagnosis not present

## 2023-08-04 DIAGNOSIS — H2702 Aphakia, left eye: Secondary | ICD-10-CM | POA: Diagnosis not present

## 2023-08-04 DIAGNOSIS — H168 Other keratitis: Secondary | ICD-10-CM | POA: Diagnosis not present

## 2023-08-04 DIAGNOSIS — B999 Unspecified infectious disease: Secondary | ICD-10-CM | POA: Diagnosis not present

## 2023-08-08 DIAGNOSIS — K602 Anal fissure, unspecified: Secondary | ICD-10-CM | POA: Diagnosis not present

## 2023-08-25 DIAGNOSIS — I8310 Varicose veins of unspecified lower extremity with inflammation: Secondary | ICD-10-CM | POA: Diagnosis not present

## 2023-08-25 DIAGNOSIS — R600 Localized edema: Secondary | ICD-10-CM | POA: Diagnosis not present

## 2023-08-30 DIAGNOSIS — F4323 Adjustment disorder with mixed anxiety and depressed mood: Secondary | ICD-10-CM | POA: Diagnosis not present

## 2023-09-07 DIAGNOSIS — F419 Anxiety disorder, unspecified: Secondary | ICD-10-CM | POA: Diagnosis not present

## 2023-09-07 DIAGNOSIS — E7800 Pure hypercholesterolemia, unspecified: Secondary | ICD-10-CM | POA: Diagnosis not present

## 2023-09-07 DIAGNOSIS — I7121 Aneurysm of the ascending aorta, without rupture: Secondary | ICD-10-CM | POA: Diagnosis not present

## 2023-09-07 DIAGNOSIS — R002 Palpitations: Secondary | ICD-10-CM | POA: Diagnosis not present

## 2023-09-07 DIAGNOSIS — M549 Dorsalgia, unspecified: Secondary | ICD-10-CM | POA: Diagnosis not present

## 2023-09-07 DIAGNOSIS — F320 Major depressive disorder, single episode, mild: Secondary | ICD-10-CM | POA: Diagnosis not present

## 2023-09-09 DIAGNOSIS — R001 Bradycardia, unspecified: Secondary | ICD-10-CM | POA: Diagnosis not present

## 2023-09-09 DIAGNOSIS — F411 Generalized anxiety disorder: Secondary | ICD-10-CM | POA: Diagnosis not present

## 2023-09-09 DIAGNOSIS — R002 Palpitations: Secondary | ICD-10-CM | POA: Diagnosis not present

## 2023-09-13 DIAGNOSIS — R002 Palpitations: Secondary | ICD-10-CM | POA: Diagnosis not present

## 2023-09-13 DIAGNOSIS — I712 Thoracic aortic aneurysm, without rupture, unspecified: Secondary | ICD-10-CM | POA: Diagnosis not present

## 2023-09-20 DIAGNOSIS — R69 Illness, unspecified: Secondary | ICD-10-CM | POA: Diagnosis not present

## 2023-09-23 ENCOUNTER — Ambulatory Visit: Payer: Medicare HMO | Attending: Cardiology | Admitting: Cardiology

## 2023-09-24 ENCOUNTER — Encounter: Payer: Self-pay | Admitting: Cardiology

## 2023-10-01 DEATH — deceased

## 2023-10-05 ENCOUNTER — Encounter: Payer: Self-pay | Admitting: Cardiology
